# Patient Record
Sex: Female | Born: 1995 | Race: White | Hispanic: No | Marital: Single | Smoking: Never smoker
Health system: Southern US, Community
[De-identification: ages and names within clinical notes are randomized; demographics above are authoritative.]

## PROBLEM LIST (undated history)

## (undated) DIAGNOSIS — Q231 Congenital insufficiency of aortic valve: Secondary | ICD-10-CM

## (undated) DIAGNOSIS — Q2381 Bicuspid aortic valve: Secondary | ICD-10-CM

## (undated) DIAGNOSIS — F319 Bipolar disorder, unspecified: Secondary | ICD-10-CM

## (undated) DIAGNOSIS — J45909 Unspecified asthma, uncomplicated: Secondary | ICD-10-CM

## (undated) HISTORY — DX: Congenital insufficiency of aortic valve: Q23.1

## (undated) HISTORY — PX: TONSILLECTOMY: SUR1361

---

## 2019-07-15 DIAGNOSIS — L209 Atopic dermatitis, unspecified: Secondary | ICD-10-CM | POA: Diagnosis not present

## 2019-07-23 DIAGNOSIS — F900 Attention-deficit hyperactivity disorder, predominantly inattentive type: Secondary | ICD-10-CM | POA: Diagnosis not present

## 2019-07-23 DIAGNOSIS — F3176 Bipolar disorder, in full remission, most recent episode depressed: Secondary | ICD-10-CM | POA: Diagnosis not present

## 2019-07-23 DIAGNOSIS — F419 Anxiety disorder, unspecified: Secondary | ICD-10-CM | POA: Diagnosis not present

## 2019-08-08 DIAGNOSIS — L309 Dermatitis, unspecified: Secondary | ICD-10-CM | POA: Diagnosis not present

## 2019-08-08 DIAGNOSIS — Z6826 Body mass index (BMI) 26.0-26.9, adult: Secondary | ICD-10-CM | POA: Diagnosis not present

## 2019-08-08 DIAGNOSIS — J4520 Mild intermittent asthma, uncomplicated: Secondary | ICD-10-CM | POA: Diagnosis not present

## 2019-08-27 DIAGNOSIS — Z9889 Other specified postprocedural states: Secondary | ICD-10-CM | POA: Diagnosis not present

## 2019-08-27 DIAGNOSIS — Z23 Encounter for immunization: Secondary | ICD-10-CM | POA: Diagnosis not present

## 2019-08-27 DIAGNOSIS — N871 Moderate cervical dysplasia: Secondary | ICD-10-CM | POA: Diagnosis not present

## 2019-08-27 DIAGNOSIS — Z6829 Body mass index (BMI) 29.0-29.9, adult: Secondary | ICD-10-CM | POA: Diagnosis not present

## 2019-08-27 DIAGNOSIS — Z9229 Personal history of other drug therapy: Secondary | ICD-10-CM | POA: Diagnosis not present

## 2019-10-16 DIAGNOSIS — E559 Vitamin D deficiency, unspecified: Secondary | ICD-10-CM | POA: Diagnosis not present

## 2019-10-16 DIAGNOSIS — E539 Vitamin B deficiency, unspecified: Secondary | ICD-10-CM | POA: Diagnosis not present

## 2019-10-16 DIAGNOSIS — Z5181 Encounter for therapeutic drug level monitoring: Secondary | ICD-10-CM | POA: Diagnosis not present

## 2019-10-16 DIAGNOSIS — R002 Palpitations: Secondary | ICD-10-CM | POA: Diagnosis not present

## 2019-10-24 DIAGNOSIS — F900 Attention-deficit hyperactivity disorder, predominantly inattentive type: Secondary | ICD-10-CM | POA: Diagnosis not present

## 2019-10-24 DIAGNOSIS — F3176 Bipolar disorder, in full remission, most recent episode depressed: Secondary | ICD-10-CM | POA: Diagnosis not present

## 2019-10-24 DIAGNOSIS — F419 Anxiety disorder, unspecified: Secondary | ICD-10-CM | POA: Diagnosis not present

## 2019-10-29 DIAGNOSIS — F900 Attention-deficit hyperactivity disorder, predominantly inattentive type: Secondary | ICD-10-CM | POA: Diagnosis not present

## 2019-10-29 DIAGNOSIS — F419 Anxiety disorder, unspecified: Secondary | ICD-10-CM | POA: Diagnosis not present

## 2019-10-29 DIAGNOSIS — F3176 Bipolar disorder, in full remission, most recent episode depressed: Secondary | ICD-10-CM | POA: Diagnosis not present

## 2019-11-07 DIAGNOSIS — F900 Attention-deficit hyperactivity disorder, predominantly inattentive type: Secondary | ICD-10-CM | POA: Diagnosis not present

## 2019-11-07 DIAGNOSIS — F3176 Bipolar disorder, in full remission, most recent episode depressed: Secondary | ICD-10-CM | POA: Diagnosis not present

## 2019-11-07 DIAGNOSIS — F419 Anxiety disorder, unspecified: Secondary | ICD-10-CM | POA: Diagnosis not present

## 2019-12-04 DIAGNOSIS — J019 Acute sinusitis, unspecified: Secondary | ICD-10-CM | POA: Diagnosis not present

## 2020-01-28 DIAGNOSIS — F3176 Bipolar disorder, in full remission, most recent episode depressed: Secondary | ICD-10-CM | POA: Diagnosis not present

## 2020-01-28 DIAGNOSIS — F900 Attention-deficit hyperactivity disorder, predominantly inattentive type: Secondary | ICD-10-CM | POA: Diagnosis not present

## 2020-01-28 DIAGNOSIS — F419 Anxiety disorder, unspecified: Secondary | ICD-10-CM | POA: Diagnosis not present

## 2020-04-04 DIAGNOSIS — Z23 Encounter for immunization: Secondary | ICD-10-CM | POA: Diagnosis not present

## 2020-04-10 DIAGNOSIS — F3176 Bipolar disorder, in full remission, most recent episode depressed: Secondary | ICD-10-CM | POA: Diagnosis not present

## 2020-04-10 DIAGNOSIS — F900 Attention-deficit hyperactivity disorder, predominantly inattentive type: Secondary | ICD-10-CM | POA: Diagnosis not present

## 2020-04-10 DIAGNOSIS — F419 Anxiety disorder, unspecified: Secondary | ICD-10-CM | POA: Diagnosis not present

## 2020-04-18 DIAGNOSIS — R300 Dysuria: Secondary | ICD-10-CM | POA: Diagnosis not present

## 2020-04-18 DIAGNOSIS — Z6828 Body mass index (BMI) 28.0-28.9, adult: Secondary | ICD-10-CM | POA: Diagnosis not present

## 2020-04-18 DIAGNOSIS — N3000 Acute cystitis without hematuria: Secondary | ICD-10-CM | POA: Diagnosis not present

## 2020-04-30 DIAGNOSIS — F419 Anxiety disorder, unspecified: Secondary | ICD-10-CM | POA: Diagnosis not present

## 2020-04-30 DIAGNOSIS — F3176 Bipolar disorder, in full remission, most recent episode depressed: Secondary | ICD-10-CM | POA: Diagnosis not present

## 2020-04-30 DIAGNOSIS — F900 Attention-deficit hyperactivity disorder, predominantly inattentive type: Secondary | ICD-10-CM | POA: Diagnosis not present

## 2020-05-20 DIAGNOSIS — F900 Attention-deficit hyperactivity disorder, predominantly inattentive type: Secondary | ICD-10-CM | POA: Diagnosis not present

## 2020-05-20 DIAGNOSIS — F3176 Bipolar disorder, in full remission, most recent episode depressed: Secondary | ICD-10-CM | POA: Diagnosis not present

## 2020-05-20 DIAGNOSIS — F419 Anxiety disorder, unspecified: Secondary | ICD-10-CM | POA: Diagnosis not present

## 2020-05-20 DIAGNOSIS — J45909 Unspecified asthma, uncomplicated: Secondary | ICD-10-CM | POA: Diagnosis not present

## 2020-06-25 DIAGNOSIS — Z23 Encounter for immunization: Secondary | ICD-10-CM | POA: Diagnosis not present

## 2020-07-23 DIAGNOSIS — Z20822 Contact with and (suspected) exposure to covid-19: Secondary | ICD-10-CM | POA: Diagnosis not present

## 2020-07-23 DIAGNOSIS — J069 Acute upper respiratory infection, unspecified: Secondary | ICD-10-CM | POA: Diagnosis not present

## 2020-07-28 DIAGNOSIS — B370 Candidal stomatitis: Secondary | ICD-10-CM | POA: Diagnosis not present

## 2020-10-13 DIAGNOSIS — J209 Acute bronchitis, unspecified: Secondary | ICD-10-CM | POA: Diagnosis not present

## 2020-10-13 DIAGNOSIS — J4521 Mild intermittent asthma with (acute) exacerbation: Secondary | ICD-10-CM | POA: Diagnosis not present

## 2020-10-13 DIAGNOSIS — Z20822 Contact with and (suspected) exposure to covid-19: Secondary | ICD-10-CM | POA: Diagnosis not present

## 2020-11-27 DIAGNOSIS — F3176 Bipolar disorder, in full remission, most recent episode depressed: Secondary | ICD-10-CM | POA: Diagnosis not present

## 2020-11-27 DIAGNOSIS — F419 Anxiety disorder, unspecified: Secondary | ICD-10-CM | POA: Diagnosis not present

## 2020-11-27 DIAGNOSIS — F900 Attention-deficit hyperactivity disorder, predominantly inattentive type: Secondary | ICD-10-CM | POA: Diagnosis not present

## 2021-03-07 DIAGNOSIS — J4541 Moderate persistent asthma with (acute) exacerbation: Secondary | ICD-10-CM | POA: Diagnosis not present

## 2021-03-07 DIAGNOSIS — L209 Atopic dermatitis, unspecified: Secondary | ICD-10-CM | POA: Diagnosis not present

## 2021-03-12 ENCOUNTER — Other Ambulatory Visit: Payer: Self-pay

## 2021-03-12 ENCOUNTER — Encounter (HOSPITAL_COMMUNITY): Payer: Self-pay | Admitting: Emergency Medicine

## 2021-03-12 ENCOUNTER — Emergency Department (HOSPITAL_COMMUNITY): Payer: BC Managed Care – PPO

## 2021-03-12 ENCOUNTER — Emergency Department (HOSPITAL_COMMUNITY)
Admission: EM | Admit: 2021-03-12 | Discharge: 2021-03-13 | Disposition: A | Discharge status: Home / Self Care | Payer: BC Managed Care – PPO | Attending: Emergency Medicine | Admitting: Emergency Medicine

## 2021-03-12 DIAGNOSIS — R0789 Other chest pain: Secondary | ICD-10-CM | POA: Insufficient documentation

## 2021-03-12 DIAGNOSIS — J45909 Unspecified asthma, uncomplicated: Secondary | ICD-10-CM | POA: Diagnosis not present

## 2021-03-12 DIAGNOSIS — Z20822 Contact with and (suspected) exposure to covid-19: Secondary | ICD-10-CM | POA: Diagnosis not present

## 2021-03-12 DIAGNOSIS — R509 Fever, unspecified: Secondary | ICD-10-CM | POA: Diagnosis not present

## 2021-03-12 DIAGNOSIS — R079 Chest pain, unspecified: Secondary | ICD-10-CM | POA: Diagnosis not present

## 2021-03-12 DIAGNOSIS — R0602 Shortness of breath: Secondary | ICD-10-CM | POA: Diagnosis not present

## 2021-03-12 DIAGNOSIS — R000 Tachycardia, unspecified: Secondary | ICD-10-CM | POA: Diagnosis not present

## 2021-03-12 DIAGNOSIS — R059 Cough, unspecified: Secondary | ICD-10-CM | POA: Diagnosis not present

## 2021-03-12 DIAGNOSIS — Z91010 Allergy to peanuts: Secondary | ICD-10-CM | POA: Diagnosis not present

## 2021-03-12 DIAGNOSIS — N9489 Other specified conditions associated with female genital organs and menstrual cycle: Secondary | ICD-10-CM | POA: Insufficient documentation

## 2021-03-12 HISTORY — DX: Unspecified asthma, uncomplicated: J45.909

## 2021-03-12 LAB — RESP PANEL BY RT-PCR (FLU A&B, COVID) ARPGX2
Influenza A by PCR: NEGATIVE
Influenza B by PCR: NEGATIVE
SARS Coronavirus 2 by RT PCR: NEGATIVE

## 2021-03-12 LAB — CBC WITH DIFFERENTIAL/PLATELET
Abs Immature Granulocytes: 0.16 10*3/uL — ABNORMAL HIGH (ref 0.00–0.07)
Basophils Absolute: 0.1 10*3/uL (ref 0.0–0.1)
Basophils Relative: 1 %
Eosinophils Absolute: 0.3 10*3/uL (ref 0.0–0.5)
Eosinophils Relative: 2 %
HCT: 44.2 % (ref 36.0–46.0)
Hemoglobin: 14.7 g/dL (ref 12.0–15.0)
Immature Granulocytes: 1 %
Lymphocytes Relative: 17 %
Lymphs Abs: 2 10*3/uL (ref 0.7–4.0)
MCH: 30.7 pg (ref 26.0–34.0)
MCHC: 33.3 g/dL (ref 30.0–36.0)
MCV: 92.3 fL (ref 80.0–100.0)
Monocytes Absolute: 0.2 10*3/uL (ref 0.1–1.0)
Monocytes Relative: 2 %
Neutro Abs: 9.2 10*3/uL — ABNORMAL HIGH (ref 1.7–7.7)
Neutrophils Relative %: 77 %
Platelets: 487 10*3/uL — ABNORMAL HIGH (ref 150–400)
RBC: 4.79 MIL/uL (ref 3.87–5.11)
RDW: 12.2 % (ref 11.5–15.5)
WBC: 11.9 10*3/uL — ABNORMAL HIGH (ref 4.0–10.5)
nRBC: 0 % (ref 0.0–0.2)

## 2021-03-12 LAB — BASIC METABOLIC PANEL
Anion gap: 10 (ref 5–15)
BUN: 13 mg/dL (ref 6–20)
CO2: 26 mmol/L (ref 22–32)
Calcium: 10.2 mg/dL (ref 8.9–10.3)
Chloride: 104 mmol/L (ref 98–111)
Creatinine, Ser: 0.82 mg/dL (ref 0.44–1.00)
GFR, Estimated: >60 mL/min (ref >60)
Glucose, Bld: 147 mg/dL — ABNORMAL HIGH (ref 70–99)
Potassium: 4.6 mmol/L (ref 3.5–5.1)
Sodium: 140 mmol/L (ref 135–145)

## 2021-03-12 LAB — D-DIMER, QUANTITATIVE: D-Dimer, Quant: <0.27 ug/mL-FEU (ref 0.00–0.50)

## 2021-03-12 LAB — I-STAT BETA HCG BLOOD, ED (MC, WL, AP ONLY): I-stat hCG, quantitative: <5 m[IU]/mL (ref <5)

## 2021-03-12 LAB — TROPONIN I (HIGH SENSITIVITY): Troponin I (High Sensitivity): <2 ng/L (ref <18)

## 2021-03-12 MED ORDER — IOHEXOL 350 MG/ML SOLN
80.0000 mL | Freq: Once | INTRAVENOUS | Status: AC | PRN
  Administered 2021-03-12: 80 mL via INTRAVENOUS

## 2021-03-12 NOTE — ED Provider Notes (Signed)
Care assumed from [facility].  Please see her full H&P.  In short,  Megan Bernard is a 25 y.o. female presents for chest tightness and pain.  Was seen at UC earlier and sent her for PE rule out. Clear and equal breath sounds on initial provider exam.  She is taking prednisone for URI and asthma exacerbation.   Plan: CT angio and trop pending - if neg can be d/c home.   Physical Exam  BP 125/84   Pulse 92   Temp 98.6 F (37 C) (Oral)   Resp 17   Ht 5' (1.524 m)   Wt 68.9 kg   SpO2 99%   BMI 29.69 kg/m   Physical Exam Vitals and nursing note reviewed.  Constitutional:      General: She is not in acute distress.    Appearance: She is well-developed. She is not ill-appearing.  HENT:     Head: Normocephalic.  Eyes:     General: No scleral icterus.    Conjunctiva/sclera: Conjunctivae normal.  Cardiovascular:     Rate and Rhythm: Normal rate.     Comments: No longer tachycardic Pulmonary:     Effort: Pulmonary effort is normal.  Abdominal:     General: There is no distension.  Musculoskeletal:        General: Normal range of motion.     Cervical back: Normal range of motion.  Skin:    General: Skin is warm and dry.  Neurological:     Mental Status: She is alert.  Psychiatric:        Mood and Affect: Mood normal.    ED Course/Procedures     Procedures  Results for orders placed or performed during the hospital encounter of 03/12/21  Resp Panel by RT-PCR (Flu A&B, Covid) Nasopharyngeal Swab   Specimen: Nasopharyngeal Swab; Nasopharyngeal(NP) swabs in vial transport medium  Result Value Ref Range   SARS Coronavirus 2 by RT PCR NEGATIVE NEGATIVE   Influenza A by PCR NEGATIVE NEGATIVE   Influenza B by PCR NEGATIVE NEGATIVE  CBC with Differential/Platelet  Result Value Ref Range   WBC 11.9 (H) 4.0 - 10.5 K/uL   RBC 4.79 3.87 - 5.11 MIL/uL   Hemoglobin 14.7 12.0 - 15.0 g/dL   HCT 17.0 01.7 - 49.4 %   MCV 92.3 80.0 - 100.0 fL   MCH 30.7 26.0 - 34.0 pg   MCHC 33.3  30.0 - 36.0 g/dL   RDW 49.6 75.9 - 16.3 %   Platelets 487 (H) 150 - 400 K/uL   nRBC 0.0 0.0 - 0.2 %   Neutrophils Relative % 77 %   Neutro Abs 9.2 (H) 1.7 - 7.7 K/uL   Lymphocytes Relative 17 %   Lymphs Abs 2.0 0.7 - 4.0 K/uL   Monocytes Relative 2 %   Monocytes Absolute 0.2 0.1 - 1.0 K/uL   Eosinophils Relative 2 %   Eosinophils Absolute 0.3 0.0 - 0.5 K/uL   Basophils Relative 1 %   Basophils Absolute 0.1 0.0 - 0.1 K/uL   Immature Granulocytes 1 %   Abs Immature Granulocytes 0.16 (H) 0.00 - 0.07 K/uL  Basic metabolic panel  Result Value Ref Range   Sodium 140 135 - 145 mmol/L   Potassium 4.6 3.5 - 5.1 mmol/L   Chloride 104 98 - 111 mmol/L   CO2 26 22 - 32 mmol/L   Glucose, Bld 147 (H) 70 - 99 mg/dL   BUN 13 6 - 20 mg/dL   Creatinine, Ser  0.82 0.44 - 1.00 mg/dL   Calcium 62.1 8.9 - 30.8 mg/dL   GFR, Estimated >65 >78 mL/min   Anion gap 10 5 - 15  D-dimer, quantitative  Result Value Ref Range   D-Dimer, Quant <0.27 0.00 - 0.50 ug/mL-FEU  I-Stat Beta hCG blood, ED ([facility], [facility], [facility] only)  Result Value Ref Range   I-stat hCG, quantitative <5.0 <5 mIU/mL   Comment 3          Troponin I (High Sensitivity)  Result Value Ref Range   Troponin I (High Sensitivity) <2 <18 ng/L   CT Angio Chest PE W/Cm &/Or Wo Cm  Result Date: 03/13/2021 CLINICAL DATA:  Asthma, cough, chest tightness, pleuritic chest pain EXAM: CT ANGIOGRAPHY CHEST WITH CONTRAST TECHNIQUE: Multidetector CT imaging of the chest was performed using the standard protocol during bolus administration of intravenous contrast. Multiplanar CT image reconstructions and MIPs were obtained to evaluate the vascular anatomy. CONTRAST:  44mL OMNIPAQUE IOHEXOL 350 MG/ML SOLN COMPARISON:  None. FINDINGS: Cardiovascular: Satisfactory opacification of the bilateral pulmonary arteries to the lobar level. No evidence of pulmonary embolism. Although not tailored for evaluation of the thoracic aorta, there is no evidence of thoracic aortic aneurysm  or dissection. The heart is normal in size.  No pericardial effusion. Mediastinum/Nodes: No suspicious mediastinal lymphadenopathy. Visualized thyroid is unremarkable. Lungs/Pleura: Lungs are clear. No suspicious pulmonary nodules. No focal consolidation. No pleural effusion or pneumothorax. Upper Abdomen: Visualized upper abdomen is unremarkable. Musculoskeletal: Visualized osseous structures are within normal limits. Review of the MIP images confirms the above findings. IMPRESSION: No evidence of pulmonary embolism. Negative CT chest. Electronically Signed   By: Charline Bills M.D.   On: 03/13/2021 00:04     MDM   1:46 AM CT angiogram without evidence of pulmonary embolism, pneumothorax or pneumonia.  Troponin negative.  No clinical or laboratory evidence for myocarditis.  Patient did have COVID in May.  Question possible long COVID symptoms.  Patient also with lightheadedness and tachycardia with change of positions and when walking.  Concern for possibility of POTS vs other dysautonomia.  Patient will be referred for outpatient cardiology evaluation into the post-COVID care clinic.  Long discussion with patient and father about same.  They are in agreement with the plan.   Nonspecific chest pain  Shortness of breath  Tachycardia      Kathie Posa, Boyd Kerbs 03/13/21 0148    Sabas Sous, MD 03/13/21 [phone]

## 2021-03-12 NOTE — ED Triage Notes (Signed)
Patient arrives complaining of shortness of breath for 3 weeks and chest pain since Monday. Patient has been seen and was told to follow up with cardiology, but she has not yet. Patient does not smoke, but is on estrogen based bc. Patient complaining of central chest pain that radiates into her back. Patient noted to be tachypneic and tachycardic.

## 2021-03-12 NOTE — ED Notes (Signed)
Patient transported to CT 

## 2021-03-12 NOTE — ED Provider Notes (Signed)
[facility]-EMERGENCY DEPT Provider Note   CSN: [id number] Arrival date & time: 03/12/21  1931     History Chief Complaint  Patient presents with   Chest Pain   Shortness of Breath    Megan Bernard is a 25 y.o. female who presents to the ED from UC with complaint of gradual onset, constant, pleuritic, chest pain for the past week. She reports "asthma" issues for the past 3 weeks with complaint of dry cough, chest tightness, no wheezing. She states she was seen by her PCP last week and started on Prednisone without relief of her symptoms. She began having chest pain that feels different to previous chest tightness related to asthma exacerbation and went to UC today and was sent here with concern for PE given she was tachycardic, tachypneic, with hx of OCPs. Pt reports they did an antigen COVID test and a CXR at urgent care which was negative. She denies hx of DVT/PE in the past. She is on exogenous hormones. She denies recent prolonged travel or immobilization. Denies hemoptysis. No active malignancy. She reports subjective fevers and chills.   The history is provided by the patient and medical records.      Past Medical History:  Diagnosis Date   Asthma     There are no problems to display for this patient.     OB History   No obstetric history on file.     No family history on file.  Social History   Tobacco Use   Smoking status: Never   Smokeless tobacco: Never  Substance Use Topics   Alcohol use: Not Currently   Drug use: Not Currently    Home Medications Prior to Admission medications   Not on File    Allergies    Peanut-containing drug products, Amoxicillin, and Justicia adhatoda (malabar nut tree) [justicia adhatoda]  Review of Systems   Review of Systems  Constitutional:  Positive for chills and fever (subjective). Negative for diaphoresis.  Respiratory:  Positive for cough and shortness of breath.   Cardiovascular:  Positive for  chest pain.  Gastrointestinal:  Negative for nausea and vomiting.  All other systems reviewed and are negative.  Physical Exam Updated Vital Signs BP 125/84   Pulse 92   Temp 98.6 F (37 C) (Oral)   Resp 17   Ht 5' (1.524 m)   Wt 68.9 kg   SpO2 99%   BMI 29.69 kg/m   Physical Exam Vitals and nursing note reviewed.  Constitutional:      Appearance: She is not ill-appearing or diaphoretic.  HENT:     Head: Normocephalic and atraumatic.  Eyes:     Conjunctiva/sclera: Conjunctivae normal.  Cardiovascular:     Rate and Rhythm: Regular rhythm. Tachycardia present.     Heart sounds: Normal heart sounds.  Pulmonary:     Effort: Pulmonary effort is normal.     Breath sounds: Normal breath sounds. No decreased breath sounds, wheezing, rhonchi or rales.     Comments: Speaking in full sentences without difficulty. Satting 98% on RA. LCTAB without wheezing, rales, rhonchi.  Chest:     Chest wall: Tenderness present.     Comments: Diffuse chest wall TTP Abdominal:     Palpations: Abdomen is soft.     Tenderness: There is no abdominal tenderness.  Musculoskeletal:     Cervical back: Neck supple.  Skin:    General: Skin is warm and dry.  Neurological:     Mental Status: She is alert.  ED Results / Procedures / Treatments   Labs (all labs ordered are listed, but only abnormal results are displayed) Labs Reviewed  CBC WITH DIFFERENTIAL/PLATELET - Abnormal; Notable for the following components:      Result Value   WBC 11.9 (*)    Platelets 487 (*)    Neutro Abs 9.2 (*)    Abs Immature Granulocytes 0.16 (*)    All other components within normal limits  BASIC METABOLIC PANEL - Abnormal; Notable for the following components:   Glucose, Bld 147 (*)    All other components within normal limits  RESP PANEL BY RT-PCR (FLU A&B, COVID) ARPGX2  D-DIMER, QUANTITATIVE  I-STAT BETA HCG BLOOD, ED ([facility], [facility], [facility] ONLY)  TROPONIN I (HIGH SENSITIVITY)  TROPONIN I (HIGH SENSITIVITY)     EKG None  Radiology No results found.  Procedures Procedures   Medications Ordered in ED Medications  iohexol (OMNIPAQUE) 350 MG/ML injection 80 mL (80 mLs Intravenous Contrast Given 03/12/21 2337)    ED Course  I have reviewed the triage vital signs and the nursing notes.  Pertinent labs & imaging results that were available during my care of the patient were reviewed by me and considered in my medical decision making (see chart for details).    MDM Rules/Calculators/A&P                           25 year old female presents to the ED today with complaint of pleuritic chest pain and shortness of breath for the past week.  Does report she has had issues with cough for the past 3 weeks which she attributed to asthma exacerbation.  She went to urgent care today and was sent here for PE work-up.  On arrival to the ED she is afebrile at 98.6.  She is tachycardic with a heart rate of 124.  She is mildly tachypneic with respirations of 22.  She is satting 98% on room air.  She had work-up done while in the waiting room including a CBC, BMP, beta-hCG, D-dimer.  D-dimer is negative today.  She denies any other risk factors besides exogenous hormone use for PE.  Given she was sent here from urgent care to rule out PE I do feel she will likely need a CTA for further evaluation.  We will plan to add on troponin at this time given she is having chest pain.  Question pericarditis with recent viral URI.  We will also swab for COVID and flu.  She had an antigen COVID test today which was negative.  Do not feel repeat chest x-ray is necessary at this time however we will plan for CTA to evaluate for possible PE versus other pulmonary etiology.   At shift change case signed out to oncoming team [facility], PA-C, who will follow up on CTA, troponin, COVID/flu testing. If all negative feel pt can be discharged home with symptomatic treatment.   This note was prepared using Dragon voice  recognition software and may include unintentional dictation errors due to the inherent limitations of voice recognition software.   Final Clinical Impression(s) / ED Diagnoses Final diagnoses:  Nonspecific chest pain  Shortness of breath    Rx / DC Orders ED Discharge Orders     None        Tanda Rockers, PA-C 03/12/21 2343    Alvira Monday, MD 03/13/21 1000

## 2021-03-12 NOTE — ED Provider Notes (Signed)
Emergency Medicine Provider Triage Evaluation Note  Megan Bernard , a 25 y.o. female  was evaluated in triage.  Pt complains of shortness of breath.  Pt seen at Urgent care and sent here to rule out PE.    Review of Systems  Positive: Short of breath Negative: fever  Physical Exam  BP (!) 145/96 (BP Location: Left Arm)   Pulse (!) 124   Temp 98.6 F (37 C) (Oral)   Resp (!) 22   Ht 5' (1.524 m)   Wt 68.9 kg   SpO2 98%   BMI 29.69 kg/m  Gen:   Awake, no distress   Resp:  Normal effort  MSK:   Moves extremities without difficulty  Other:  Tachycardia   Medical Decision Making  Medically screening exam initiated at 7:55 PM.  Appropriate orders placed.  Megan Bernard was informed that the remainder of the evaluation will be completed by another provider, this initial triage assessment does not replace that evaluation, and the importance of remaining in the ED until their evaluation is complete.     Megan Bernard 03/12/21 1956    Megan Dibbles, MD 03/13/21 [phone]

## 2021-03-13 MED ORDER — OMEPRAZOLE 20 MG PO CPDR: 20.0000 mg | DELAYED_RELEASE_CAPSULE | Freq: Every day | ORAL | 0 refills | Status: DC

## 2021-03-13 MED ORDER — BENZONATATE 100 MG PO CAPS: 100.0000 mg | ORAL_CAPSULE | Freq: Three times a day (TID) | ORAL | 0 refills | Status: DC | PRN

## 2021-03-13 NOTE — Discharge Instructions (Addendum)
1. Medications: Alternate Tylenol and ibuprofen for body aches, headache and fever control.  600 mg of ibuprofen 3 times a day with food alternated with 1000 mg of Tylenol, Tessalon Perles for cough, omeprazole for potential gastritis, usual home medications 2. Treatment: rest, drink plenty of fluids, compression socks, increase salt intake 3. Follow Up: Please followup with your primary doctor, cardiology and post-COVID care clinic in the next week for discussion of your diagnoses and further evaluation after today's visit; if you do not have a primary care doctor use the resource guide provided to find one; Please return to the ER for new or worsening symptoms

## 2021-03-18 ENCOUNTER — Other Ambulatory Visit: Payer: Self-pay

## 2021-03-18 ENCOUNTER — Ambulatory Visit (HOSPITAL_BASED_OUTPATIENT_CLINIC_OR_DEPARTMENT_OTHER): Payer: BC Managed Care – PPO

## 2021-03-18 ENCOUNTER — Ambulatory Visit (INDEPENDENT_AMBULATORY_CARE_PROVIDER_SITE_OTHER): Payer: BC Managed Care – PPO | Admitting: Cardiology

## 2021-03-18 ENCOUNTER — Encounter (HOSPITAL_BASED_OUTPATIENT_CLINIC_OR_DEPARTMENT_OTHER): Payer: Self-pay | Admitting: Cardiology

## 2021-03-18 VITALS — BP 123/89 | HR 93 | Ht 60.0 in | Wt 149.8 lb

## 2021-03-18 DIAGNOSIS — I479 Paroxysmal tachycardia, unspecified: Secondary | ICD-10-CM | POA: Diagnosis not present

## 2021-03-18 DIAGNOSIS — Z8279 Family history of other congenital malformations, deformations and chromosomal abnormalities: Secondary | ICD-10-CM | POA: Diagnosis not present

## 2021-03-18 DIAGNOSIS — Z8249 Family history of ischemic heart disease and other diseases of the circulatory system: Secondary | ICD-10-CM

## 2021-03-18 DIAGNOSIS — R0602 Shortness of breath: Secondary | ICD-10-CM

## 2021-03-18 DIAGNOSIS — R079 Chest pain, unspecified: Secondary | ICD-10-CM

## 2021-03-18 DIAGNOSIS — Z7189 Other specified counseling: Secondary | ICD-10-CM

## 2021-03-18 NOTE — Patient Instructions (Signed)
Medication Instructions:  Your Physician recommend you continue on your current medication as directed.    *If you need a refill on your cardiac medications before your next appointment, please call your pharmacy*   Lab Work: None ordered today   Testing/Procedures: Your physician has requested that you have an echocardiogram. Echocardiography is a painless test that uses sound waves to create images of your heart. It provides your doctor with information about the size and shape of your heart and how well your heart's chambers and valves are working. This procedure takes approximately one hour. There are no restrictions for this procedure. [address]  Our physician has recommended that you wear an  7 DAY ZIO-PATCH monitor. The Zio patch cardiac monitor continuously records heart rhythm data for up to 14 days, this is for patients being evaluated for multiple types heart rhythms. For the first 24 hours post application, please avoid getting the Zio monitor wet in the shower or by excessive sweating during exercise. After that, feel free to carry on with regular activities. Keep soaps and lotions away from the ZIO XT Patch.      Follow-Up: At [facility], you and your health needs are our priority.  As part of our continuing mission to provide you with exceptional heart care, we have created designated Provider Care Teams.  These Care Teams include your primary Cardiologist (physician) and Advanced Practice Providers (APPs -  Physician Assistants and Nurse Practitioners) who all work together to provide you with the care you need, when you need it.  We recommend signing up for the patient portal called "MyChart".  Sign up information is provided on this After Visit Summary.  MyChart is used to connect with patients for Virtual Visits (Telemedicine).  Patients are able to view lab/test results, encounter notes, upcoming appointments, etc.  Non-urgent messages can be sent  to your provider as well.   To learn more about what you can do with MyChart, go to [email or web address].    Your next appointment:   Based on test results  The format for your next appointment:   In Person  Provider:   Jodelle Red, MD  Christena Deem- Long Term Monitor Instructions  Your physician has requested you wear a ZIO patch monitor for 7 days.  This is a single patch monitor. [employer] supplies one patch monitor per enrollment. Additional stickers are not available. Please do not apply patch if you will be having a Nuclear Stress Test,  Echocardiogram, Cardiac CT, MRI, or Chest Xray during the period you would be wearing the  monitor. The patch cannot be worn during these tests. You cannot remove and re-apply the  ZIO XT patch monitor.  Your ZIO patch monitor will be mailed 3 day USPS to your address on file. It may take 3-5 days  to receive your monitor after you have been enrolled.  Once you have received your monitor, please review the enclosed instructions. Your monitor  has already been registered assigning a specific monitor serial # to you.  Billing and Patient Assistance Program Information  We have supplied [employer] with any of your insurance information on file for billing purposes. [employer] offers a sliding scale Patient Assistance Program for patients that do not have  insurance, or whose insurance does not completely cover the cost of the ZIO monitor.  You must apply for the Patient Assistance Program to qualify for this discounted rate.  To apply, please call [employer] at [phone], select option 4, select  option 2, ask to apply for  Patient Assistance Program. Meredeth Ide will ask your household income, and how many people  are in your household. They will quote your out-of-pocket cost based on that information.  [employer] will also be able to set up a 7-month, interest-free payment plan if needed.  Applying the monitor   Shave hair from upper left chest.   Hold abrader disc by orange tab. Rub abrader in 40 strokes over the upper left chest as  indicated in your monitor instructions.  Clean area with 4 enclosed alcohol pads. Let dry.  Apply patch as indicated in monitor instructions. Patch will be placed under collarbone on left  side of chest with arrow pointing upward.  Rub patch adhesive wings for 2 minutes. Remove white label marked "1". Remove the white  label marked "2". Rub patch adhesive wings for 2 additional minutes.  While looking in a mirror, press and release button in center of patch. A small green light will  flash 3-4 times. This will be your only indicator that the monitor has been turned on.  Do not shower for the first 24 hours. You may shower after the first 24 hours.  Press the button if you feel a symptom. You will hear a small click. Record Date, Time and  Symptom in the Patient Logbook.  When you are ready to remove the patch, follow instructions on the last 2 pages of Patient  Logbook. Stick patch monitor onto the last page of Patient Logbook.  Place Patient Logbook in the blue and white box. Use locking tab on box and tape box closed  securely. The blue and white box has prepaid postage on it. Please place it in the mailbox as  soon as possible. Your physician should have your test results approximately 7 days after the  monitor has been mailed back to [facility].  Call [facility] Customer Care at [phone] if you have questions regarding  your ZIO XT patch monitor. Call them immediately if you see an orange light blinking on your  monitor.  If your monitor falls off in less than 4 days, contact our Monitor department at [phone].  If your monitor becomes loose or falls off after 4 days call [employer] at [phone] for  suggestions on securing your monitor

## 2021-03-18 NOTE — Progress Notes (Signed)
Cardiology Office Note:    Date:  03/18/2021   ID:  Megan Bernard, DOB 1996-05-02, MRN [id number]  PCP:  Pcp, No  Cardiologist:  Jodelle Red, MD  Referring MD: Dr. Dalene Seltzer (ER)  CC: new patient consultation for tachycardia.  History of Present Illness:    Megan Bernard is a 25 y.o. female without significant PMH who is seen as a new consult at the request of Dr. Dalene Seltzer for the evaluation and management of tachycardia.  ER notes from 03/12/2021 reviewed. hsTnI <2, resp panel negative for Covid, hcg negative, d dimer negative, BMET unremarkable, CBC with WBC 11.9. CTPE negative for PE, otherwise unremarkable.  Today: She notes that she has chronically had a faster heart rate. She has felt lightheaded and has had intermittent syncope for years. She had Covid in May, felt that she was doing fine until several weeks ago. Noted to have chest tightness and shortness of breath, sent to ER due to concern for PE. Workup unremarkable, referred to cardiology   She has felt "like total ass" for the last few weeks. No energy, can't get out of bed. Is in law school, has been extremely difficult to go to class. Has also broken out in a rash across her body. She has never been diagnosed with an autoimmune disease but reports having flare type events in the past. Continues to have chest pain, shortness of breath, and nonproductive.  Last syncope >1 year ago, but has frequent near syncope. Noted that her heart rate can get to 150 bpm and still be 120s after lying down.   She is active and does cardio. Likes to do elliptical. Can get heart rate to target easily. Wears out easily, tired after a workout. Does it because she feels like she should, not because she wants to.  Her mother has fibromyalgia, EBV, many heart issues. Had a murmur, had a valve issue. Had aorta and some valve replaced. Has bicuspid aortic valve. Patient has been told she needs evaluation but has never seen a cardiologist or  had an echo.   Maternal cousin has worn a monitor, ?pacemaker  She is taking medications for mental health. Stopped lexapro two weeks ago; she was feeling somewhat bad before hand but felt worse when she stopped. Planning to start back today.   Denies chest pain, shortness of breath at rest or with normal exertion. No PND, orthopnea, LE edema or unexpected weight gain.  Past Medical History:  Diagnosis Date   Asthma     History reviewed. No pertinent surgical history.  Current Medications: Current Outpatient Medications on File Prior to Visit  Medication Sig   albuterol (PROVENTIL) (2.5 MG/3ML) 0.083% nebulizer solution as needed.   albuterol (VENTOLIN HFA) 108 (90 Base) MCG/ACT inhaler as needed.   beclomethasone (QVAR REDIHALER) 40 MCG/ACT inhaler as needed.   buPROPion (WELLBUTRIN XL) 150 MG 24 hr tablet daily.   buPROPion (WELLBUTRIN XL) 300 MG 24 hr tablet daily.   EPINEPHrine (EPIPEN JR) 0.15 MG/0.3ML injection Inject into the muscle as needed.   escitalopram (LEXAPRO) 10 MG tablet Take 10 mg by mouth daily.   hydrOXYzine (ATARAX/VISTARIL) 25 MG tablet as needed.   Lactobacillus-Inulin (CULTURELLE DIGESTIVE DAILY) CAPS Take by mouth daily at 6 (six) AM.   lamoTRIgine (LAMICTAL) 200 MG tablet Take 200 mg by mouth daily.   LORazepam (ATIVAN) 0.5 MG tablet as needed.   methylphenidate (RITALIN) 5 MG tablet Take 7.5-10 mg by mouth as needed.   norgestimate-ethinyl estradiol (ORTHO-CYCLEN) 0.25-35 MG-MCG tablet  Take 1 tablet by mouth daily.   triamcinolone ointment (KENALOG) 0.1 % Apply topically as needed.   No current facility-administered medications on file prior to visit.     Allergies:   Peanut-containing drug products, Amoxicillin, Nickel, Other, and Justicia adhatoda (malabar nut tree) [justicia adhatoda]   Social History   Tobacco Use   Smoking status: Never   Smokeless tobacco: Never  Substance Use Topics   Alcohol use: Not Currently   Drug use: Not Currently     Family History: Her mother has fibromyalgia, EBV, many heart issues. Had a murmur, had a valve issue. Had aorta and some valve replaced. Has bicuspid aortic valve. Patient has been told she needs evaluation but has never seen a cardiologist or had an echo.   ROS:   Please see the history of present illness.  Additional pertinent ROS: Constitutional: Positive for chills, fever, night sweats  HENT: Negative for ear pain and hearing loss.   Eyes: Negative for loss of vision and eye pain.  Respiratory: Negative for sputum, wheezing.  Positive is nonproductive  Cardiovascular: See HPI. Gastrointestinal: Negative for abdominal pain, melena, and hematochezia.  Genitourinary: Negative for dysuria and hematuria.  Musculoskeletal: Negative for falls and myalgias.  Skin: Positive for itching and rash.  Neurological: Negative for focal weakness, focal sensory changes and loss of consciousness.  Endo/Heme/Allergies: Does bruise/bleed easily.     EKGs/Labs/Other Studies Reviewed:    The following studies were reviewed today: No prior cardiac studies.   EKG:  EKG is personally reviewed.   03/18/21 NSR at 93 bpm, iRBBB (normal for age)  Recent Labs: 03/12/2021: BUN 13; Creatinine, Ser 0.82; Hemoglobin 14.7; Platelets 487; Potassium 4.6; Sodium 140  Recent Lipid Panel No results found for: CHOL, TRIG, HDL, CHOLHDL, VLDL, LDLCALC, LDLDIRECT  Physical Exam:    VS:  BP 123/89   Pulse 93   Ht 5' (1.524 m)   Wt 149 lb 12.8 oz (67.9 kg)   SpO2 96%   BMI 29.26 kg/m    Orthostatics; Lying 118/76, HR 88 Sitting 128/88, HR 89 Standing 123/78, HR 90 Standing 3 min 126/87, HR 95  Wt Readings from Last 3 Encounters:  03/18/21 149 lb 12.8 oz (67.9 kg)  03/12/21 152 lb (68.9 kg)    GEN: Well nourished, well developed in no acute distress HEENT: Normal, moist mucous membranes NECK: No JVD CARDIAC: regular rhythm, normal S1 and S2, no rubs or gallops. No murmur. VASCULAR: Radial and DP pulses  2+ bilaterally. No carotid bruits RESPIRATORY:  Clear to auscultation without rales, wheezing or rhonchi  ABDOMEN: Soft, non-tender, non-distended MUSCULOSKELETAL:  Ambulates independently SKIN: Warm and dry, no edema. Scattered papular rash across chest, arms, neck, face, and upper legs. Reports similar to her prior eczema flares. NEUROLOGIC:  Alert and oriented x 3. No focal neuro deficits noted. PSYCHIATRIC:  Normal affect    ASSESSMENT:    1. Family history of bicuspid aortic valve   2. Paroxysmal tachycardia (HCC)   3. Family history of heart disease   4. Chest pain of uncertain etiology   5. Shortness of breath   6. Cardiac risk counseling   7. Counseling on health promotion and disease prevention    PLAN:    Chest tightness, shortness of breath -unremarkable workup in the ER -negative Tn, unlikely endocarditis -negative PE -lungs clear on CT  Tachycardia, intermittent Near syncope -improved while in ER -history of Covid in 11/2020, concern for dysautonomia -orthostatics today not suggestive of POTS -reviewed recommendations  for hydration, compression stockings -will place event monitor for further evaluation -ECG unremarkable today  Mother with bicuspid aortic valve -echocardiogram for screening  Cardiac risk counseling and prevention recommendations: -recommend heart healthy/Mediterranean diet, with whole grains, fruits, vegetable, fish, lean meats, nuts, and olive oil. Limit salt. -recommend moderate walking, 3-5 times/week for 30-50 minutes each session. Aim for at least 150 minutes.week. Goal should be pace of 3 miles/hours, or walking 1.5 miles in 30 minutes -recommend avoidance of tobacco products. Avoid excess alcohol. -ASCVD risk score: The ASCVD Risk score Denman George DC Jr., et al., 2013) failed to calculate for the following reasons:   The 2013 ASCVD risk score is only valid for ages 39 to 70    Plan for follow up: to be determined based on results of  testing  Jodelle Red, MD, PhD, [facility] [place]  [facility]    Medication Adjustments/Labs and Tests Ordered: Current medicines are reviewed at length with the patient today.  Concerns regarding medicines are outlined above.  Orders Placed This Encounter  Procedures   LONG TERM MONITOR (3-14 DAYS)   EKG 12-Lead   ECHOCARDIOGRAM COMPLETE    No orders of the defined types were placed in this encounter.   Patient Instructions  Medication Instructions:  Your Physician recommend you continue on your current medication as directed.    *If you need a refill on your cardiac medications before your next appointment, please call your pharmacy*   Lab Work: None ordered today   Testing/Procedures: Your physician has requested that you have an echocardiogram. Echocardiography is a painless test that uses sound waves to create images of your heart. It provides your doctor with information about the size and shape of your heart and how well your heart's chambers and valves are working. This procedure takes approximately one hour. There are no restrictions for this procedure. [address]  Our physician has recommended that you wear an  7 DAY ZIO-PATCH monitor. The Zio patch cardiac monitor continuously records heart rhythm data for up to 14 days, this is for patients being evaluated for multiple types heart rhythms. For the first 24 hours post application, please avoid getting the Zio monitor wet in the shower or by excessive sweating during exercise. After that, feel free to carry on with regular activities. Keep soaps and lotions away from the ZIO XT Patch.      Follow-Up: At [facility], you and your health needs are our priority.  As part of our continuing mission to provide you with exceptional heart care, we have created designated Provider Care Teams.  These Care Teams include your primary Cardiologist (physician) and Advanced Practice Providers  (APPs -  Physician Assistants and Nurse Practitioners) who all work together to provide you with the care you need, when you need it.  We recommend signing up for the patient portal called "MyChart".  Sign up information is provided on this After Visit Summary.  MyChart is used to connect with patients for Virtual Visits (Telemedicine).  Patients are able to view lab/test results, encounter notes, upcoming appointments, etc.  Non-urgent messages can be sent to your provider as well.   To learn more about what you can do with MyChart, go to [email or web address].    Your next appointment:   Based on test results  The format for your next appointment:   In Person  Provider:   Jodelle Red, MD  Christena Deem- Long Term Monitor Instructions  Your physician has requested you wear  a ZIO patch monitor for 7 days.  This is a single patch monitor. [employer] supplies one patch monitor per enrollment. Additional stickers are not available. Please do not apply patch if you will be having a Nuclear Stress Test,  Echocardiogram, Cardiac CT, MRI, or Chest Xray during the period you would be wearing the  monitor. The patch cannot be worn during these tests. You cannot remove and re-apply the  ZIO XT patch monitor.  Your ZIO patch monitor will be mailed 3 day USPS to your address on file. It may take 3-5 days  to receive your monitor after you have been enrolled.  Once you have received your monitor, please review the enclosed instructions. Your monitor  has already been registered assigning a specific monitor serial # to you.  Billing and Patient Assistance Program Information  We have supplied [employer] with any of your insurance information on file for billing purposes. [employer] offers a sliding scale Patient Assistance Program for patients that do not have  insurance, or whose insurance does not completely cover the cost of the ZIO monitor.  You must apply for the Patient Assistance Program to  qualify for this discounted rate.  To apply, please call [employer] at [phone], select option 4, select option 2, ask to apply for  Patient Assistance Program. Meredeth Ide will ask your household income, and how many people  are in your household. They will quote your out-of-pocket cost based on that information.  [employer] will also be able to set up a 57-month, interest-free payment plan if needed.  Applying the monitor   Shave hair from upper left chest.  Hold abrader disc by orange tab. Rub abrader in 40 strokes over the upper left chest as  indicated in your monitor instructions.  Clean area with 4 enclosed alcohol pads. Let dry.  Apply patch as indicated in monitor instructions. Patch will be placed under collarbone on left  side of chest with arrow pointing upward.  Rub patch adhesive wings for 2 minutes. Remove white label marked "1". Remove the white  label marked "2". Rub patch adhesive wings for 2 additional minutes.  While looking in a mirror, press and release button in center of patch. A small green light will  flash 3-4 times. This will be your only indicator that the monitor has been turned on.  Do not shower for the first 24 hours. You may shower after the first 24 hours.  Press the button if you feel a symptom. You will hear a small click. Record Date, Time and  Symptom in the Patient Logbook.  When you are ready to remove the patch, follow instructions on the last 2 pages of Patient  Logbook. Stick patch monitor onto the last page of Patient Logbook.  Place Patient Logbook in the blue and white box. Use locking tab on box and tape box closed  securely. The blue and white box has prepaid postage on it. Please place it in the mailbox as  soon as possible. Your physician should have your test results approximately 7 days after the  monitor has been mailed back to [facility].  Call [facility] Customer Care at [phone] if you have questions regarding  your ZIO XT  patch monitor. Call them immediately if you see an orange light blinking on your  monitor.  If your monitor falls off in less than 4 days, contact our Monitor department at [phone].  If your monitor becomes loose or falls off after 4 days call [employer] at  [phone] for  suggestions on securing your monitor   Signed, Jodelle Red, MD PhD 03/18/2021   [facility]

## 2021-03-21 DIAGNOSIS — L209 Atopic dermatitis, unspecified: Secondary | ICD-10-CM | POA: Diagnosis not present

## 2021-03-23 ENCOUNTER — Other Ambulatory Visit: Payer: Self-pay

## 2021-03-23 ENCOUNTER — Ambulatory Visit (INDEPENDENT_AMBULATORY_CARE_PROVIDER_SITE_OTHER): Payer: BC Managed Care – PPO | Admitting: Nurse Practitioner

## 2021-03-23 VITALS — BP 115/77 | HR 97 | Temp 97.8°F | Resp 18 | Ht 60.0 in | Wt 149.2 lb

## 2021-03-23 DIAGNOSIS — Z8709 Personal history of other diseases of the respiratory system: Secondary | ICD-10-CM

## 2021-03-23 DIAGNOSIS — R5381 Other malaise: Secondary | ICD-10-CM

## 2021-03-23 DIAGNOSIS — R000 Tachycardia, unspecified: Secondary | ICD-10-CM

## 2021-03-23 DIAGNOSIS — R0602 Shortness of breath: Secondary | ICD-10-CM | POA: Diagnosis not present

## 2021-03-23 DIAGNOSIS — R053 Chronic cough: Secondary | ICD-10-CM | POA: Diagnosis not present

## 2021-03-23 DIAGNOSIS — Z8616 Personal history of COVID-19: Secondary | ICD-10-CM

## 2021-03-23 MED ORDER — BUDESONIDE-FORMOTEROL FUMARATE 80-4.5 MCG/ACT IN AERO: 2.0000 | INHALATION_SPRAY | Freq: Two times a day (BID) | RESPIRATORY_TRACT | 3 refills | Status: DC

## 2021-03-23 NOTE — Patient Instructions (Addendum)
History of Covid Cough Fatigue Tachycardia:   Stay well hydrated  Stay active  Deep breathing exercises  May take tylenol for fever or pain  Will place referral to pulmonary  Will place referral to EP  Will place referral to PT  Continue Prilosec  Will order Symbicort   Follow up:  Follow up in 3 months or sooner if needed

## 2021-03-23 NOTE — Progress Notes (Signed)
Pt presents for post Covid symptoms

## 2021-03-23 NOTE — Progress Notes (Signed)
@Patient  ID: , female    DOB: 1995-12-23, 25 y.o.   MRN: [id number]  Chief Complaint  Patient presents with   POST COVID     Referring provider: No ref. provider found   HPI  Patient presents today for post-COVID care clinic visit.  Patient states she tested positive for COVID in May and did receive Paxlovid.  Patient states that she does have cough, intermittent chest pain, heart racing, issues with blood pressure fluctuating, and fatigue.  Patient has been evaluated by cardiology and has been ordered Zio patch monitor.  She did have EKG and orthostatic blood pressures were not concerning for POTS. Denies f/c/s, n/v/d, hemoptysis, PND, edema.      Allergies  Allergen Reactions   Peanut-Containing Drug Products Anaphylaxis and Rash   Amoxicillin Rash   Nickel Dermatitis    asthmarash  asthmarash asthmarash asthmarash  asthmarash asthmarash    Other Rash    Nuts Nickel Nuts Nickel Nuts Nickel Nuts Nickel    Justicia Adhatoda (Malabar Nut Tree) [Justicia Adhatoda] Rash    All nuts     There is no immunization history on file for this patient.  Past Medical History:  Diagnosis Date   Asthma     Tobacco History: Social History   Tobacco Use  Smoking Status Never  Smokeless Tobacco Never   Counseling given: Not Answered   Outpatient Encounter Medications as of 03/23/2021  Medication Sig   budesonide-formoterol (SYMBICORT) 80-4.5 MCG/ACT inhaler Inhale 2 puffs into the lungs 2 (two) times daily.   budesonide-formoterol (SYMBICORT) 80-4.5 MCG/ACT inhaler Inhale 2 puffs into the lungs 2 (two) times daily.   predniSONE (STERAPRED UNI-PAK 21 TAB) 10 MG (21) TBPK tablet Take by mouth daily.   albuterol (PROVENTIL) (2.5 MG/3ML) 0.083% nebulizer solution as needed.   albuterol (VENTOLIN HFA) 108 (90 Base) MCG/ACT inhaler as needed.   beclomethasone (QVAR REDIHALER) 40 MCG/ACT inhaler as needed.   buPROPion (WELLBUTRIN XL) 150 MG 24 hr tablet  daily.   buPROPion (WELLBUTRIN XL) 300 MG 24 hr tablet daily.   EPINEPHrine (EPIPEN JR) 0.15 MG/0.3ML injection Inject into the muscle as needed.   escitalopram (LEXAPRO) 10 MG tablet Take 10 mg by mouth daily.   hydrOXYzine (ATARAX/VISTARIL) 25 MG tablet as needed.   Lactobacillus-Inulin (CULTURELLE DIGESTIVE DAILY) CAPS Take by mouth daily at 6 (six) AM.   lamoTRIgine (LAMICTAL) 200 MG tablet Take 200 mg by mouth daily.   LORazepam (ATIVAN) 0.5 MG tablet as needed.   methylphenidate (RITALIN) 5 MG tablet Take 7.5-10 mg by mouth as needed.   norgestimate-ethinyl estradiol (ORTHO-CYCLEN) 0.25-35 MG-MCG tablet Take 1 tablet by mouth daily.   triamcinolone ointment (KENALOG) 0.1 % Apply topically as needed.   No facility-administered encounter medications on file as of 03/23/2021.     Review of Systems  Review of Systems  Constitutional:  Positive for fatigue.  Respiratory:  Positive for cough.   Cardiovascular:  Positive for chest pain and palpitations.      Physical Exam  BP 115/77 (BP Location: Left Arm, Patient Position: Sitting, Cuff Size: Normal)   Pulse 97   Temp 97.8 F (36.6 C)   Resp 18   Ht 5' (1.524 m)   Wt 149 lb 3.2 oz (67.7 kg)   SpO2 97%   BMI 29.14 kg/m   Wt Readings from Last 5 Encounters:  03/23/21 149 lb 3.2 oz (67.7 kg)  03/18/21 149 lb 12.8 oz (67.9 kg)  03/12/21 152 lb (68.9 kg)  Physical Exam Vitals and nursing note reviewed.  Constitutional:      General: She is not in acute distress.    Appearance: She is well-developed.  Cardiovascular:     Rate and Rhythm: Normal rate and regular rhythm.  Pulmonary:     Effort: Pulmonary effort is normal.     Breath sounds: Normal breath sounds.  Neurological:     Mental Status: She is alert and oriented to person, place, and time.     Lab Results:  CBC    Component Value Date/Time   WBC 11.9 (H) 03/12/2021 2027   RBC 4.79 03/12/2021 2027   HGB 14.7 03/12/2021 2027   HCT 44.2 03/12/2021  2027   PLT 487 (H) 03/12/2021 2027   MCV 92.3 03/12/2021 2027   MCH 30.7 03/12/2021 2027   MCHC 33.3 03/12/2021 2027   RDW 12.2 03/12/2021 2027   LYMPHSABS 2.0 03/12/2021 2027   MONOABS 0.2 03/12/2021 2027   EOSABS 0.3 03/12/2021 2027   BASOSABS 0.1 03/12/2021 2027    BMET    Component Value Date/Time   NA 140 03/12/2021 2027   K 4.6 03/12/2021 2027   CL 104 03/12/2021 2027   CO2 26 03/12/2021 2027   GLUCOSE 147 (H) 03/12/2021 2027   BUN 13 03/12/2021 2027   CREATININE 0.82 03/12/2021 2027   CALCIUM 10.2 03/12/2021 2027   GFRNONAA >60 03/12/2021 2027    BNP No results found for: BNP  ProBNP No results found for: PROBNP  Imaging: No results found.   Assessment & Plan:   History of COVID-19 Cough Fatigue Tachycardia:   Stay well hydrated  Stay active  Deep breathing exercises  May take tylenol for fever or pain  Will place referral to pulmonary  Will place referral to EP  Will place referral to PT  Continue Prilosec  Will order Symbicort   Follow up:  Follow up in 3 months or sooner if needed     Ivonne Andrew, NP 06/22/2021

## 2021-03-27 MED ORDER — BUDESONIDE-FORMOTEROL FUMARATE 80-4.5 MCG/ACT IN AERO: 2.0000 | INHALATION_SPRAY | Freq: Two times a day (BID) | RESPIRATORY_TRACT | 12 refills | Status: DC

## 2021-04-06 ENCOUNTER — Ambulatory Visit (HOSPITAL_COMMUNITY): Payer: BC Managed Care – PPO | Attending: Cardiology

## 2021-04-06 ENCOUNTER — Other Ambulatory Visit: Payer: Self-pay

## 2021-04-06 DIAGNOSIS — Z8279 Family history of other congenital malformations, deformations and chromosomal abnormalities: Secondary | ICD-10-CM | POA: Diagnosis not present

## 2021-04-06 DIAGNOSIS — Z1379 Encounter for other screening for genetic and chromosomal anomalies: Secondary | ICD-10-CM | POA: Diagnosis not present

## 2021-04-06 LAB — ECHOCARDIOGRAM COMPLETE
Area-P 1/2: 4.61 cm2
S' Lateral: 2.9 cm

## 2021-04-08 ENCOUNTER — Telehealth: Payer: Self-pay | Admitting: Cardiology

## 2021-04-08 NOTE — Telephone Encounter (Signed)
° °  Pt is returning call to get echo result °

## 2021-04-10 NOTE — Telephone Encounter (Signed)
Pt updated with ECHO results and verbalized understanding.  

## 2021-04-22 ENCOUNTER — Encounter (HOSPITAL_BASED_OUTPATIENT_CLINIC_OR_DEPARTMENT_OTHER): Payer: Self-pay

## 2021-04-22 ENCOUNTER — Telehealth (HOSPITAL_BASED_OUTPATIENT_CLINIC_OR_DEPARTMENT_OTHER): Payer: Self-pay | Admitting: Cardiology

## 2021-04-22 NOTE — Telephone Encounter (Signed)
Attempted to reach patient--left message to call back. ZIO monitor has not been returned. Will send MyChart message as well.

## 2021-05-05 ENCOUNTER — Telehealth (HOSPITAL_BASED_OUTPATIENT_CLINIC_OR_DEPARTMENT_OTHER): Payer: Self-pay | Admitting: Cardiology

## 2021-05-05 NOTE — Telephone Encounter (Signed)
LMTCB concerning ZIO monitor---not returned

## 2021-05-07 ENCOUNTER — Institutional Professional Consult (permissible substitution): Payer: BC Managed Care – PPO | Admitting: Cardiology

## 2021-05-07 NOTE — Progress Notes (Incomplete)
Electrophysiology Office Note:    Date:  05/07/2021   ID:  Megan Bernard, DOB [birth year], MRN [id number]  PCP:  Pcp, No  [facility] Cardiologist:  Jodelle Red, MD  [facility] Electrophysiologist:  None   Referring MD: Ivonne Andrew, NP   Chief Complaint: Tachycardia  History of Present Illness:    Megan Bernard is a 25 y.o. female who presents for an evaluation of tachycardia at the request of Dr. Cristal Deer. Their medical history includes asthma.  The patient was seen by Dr. Cristal Deer on March 18, 2021 for tachycardia.  At that appointment she reported a chronically elevated heart rate.  She also reported fatigue at that appointment.     Past Medical History:  Diagnosis Date   Asthma     No past surgical history on file.  Current Medications: No outpatient medications have been marked as taking for the 05/07/21 encounter (Appointment) with Lanier Prude, MD.     Allergies:   Peanut-containing drug products, Amoxicillin, Nickel, Other, and Justicia adhatoda (malabar nut tree) [justicia adhatoda]   Social History   Socioeconomic History   Marital status: Single    Spouse name: Not on file   Number of children: Not on file   Years of education: Not on file   Highest education level: Not on file  Occupational History   Not on file  Tobacco Use   Smoking status: Never   Smokeless tobacco: Never  Substance and Sexual Activity   Alcohol use: Not Currently   Drug use: Not Currently   Sexual activity: Not on file  Other Topics Concern   Not on file  Social History Narrative   Not on file   Social Determinants of Health   Financial Resource Strain: Not on file  Food Insecurity: Not on file  Transportation Needs: Not on file  Physical Activity: Not on file  Stress: Not on file  Social Connections: Not on file     Family History: The patient's family history is not on file.  ROS:   Please see the history of present illness.     All other systems reviewed and are negative.  EKGs/Labs/Other Studies Reviewed:    The following studies were reviewed today: ***  EKG:  The ekg ordered today demonstrates ***   Recent Labs: 03/12/2021: BUN 13; Creatinine, Ser 0.82; Hemoglobin 14.7; Platelets 487; Potassium 4.6; Sodium 140  Recent Lipid Panel No results found for: CHOL, TRIG, HDL, CHOLHDL, VLDL, LDLCALC, LDLDIRECT  Physical Exam:    VS:  There were no vitals taken for this visit.    Wt Readings from Last 3 Encounters:  03/23/21 149 lb 3.2 oz (67.7 kg)  03/18/21 149 lb 12.8 oz (67.9 kg)  03/12/21 152 lb (68.9 kg)     GEN: *** Well nourished, well developed in no acute distress HEENT: Normal NECK: No JVD; No carotid bruits LYMPHATICS: No lymphadenopathy CARDIAC: ***RRR, no murmurs, rubs, gallops RESPIRATORY:  Clear to auscultation without rales, wheezing or rhonchi  ABDOMEN: Soft, non-tender, non-distended MUSCULOSKELETAL:  No edema; No deformity  SKIN: Warm and dry NEUROLOGIC:  Alert and oriented x 3 PSYCHIATRIC:  Normal affect       ASSESSMENT:    No diagnosis found. PLAN:    In order of problems listed above:         Total time spent with patient today *** minutes. This includes reviewing records, evaluating the patient and coordinating care.  Medication Adjustments/Labs and Tests Ordered: Current medicines are reviewed at  length with the patient today.  Concerns regarding medicines are outlined above.  No orders of the defined types were placed in this encounter.  No orders of the defined types were placed in this encounter.    Signed, Rossie Muskrat. Lalla Brothers, MD, [facility], [facility] 05/07/2021 8:16 AM    Electrophysiology [facility]

## 2021-05-14 ENCOUNTER — Ambulatory Visit: Payer: BC Managed Care – PPO | Admitting: Family Medicine

## 2021-05-21 ENCOUNTER — Encounter: Payer: Self-pay | Admitting: Cardiology

## 2021-05-30 DIAGNOSIS — J029 Acute pharyngitis, unspecified: Secondary | ICD-10-CM | POA: Diagnosis not present

## 2021-05-30 DIAGNOSIS — Z20822 Contact with and (suspected) exposure to covid-19: Secondary | ICD-10-CM | POA: Diagnosis not present

## 2021-05-30 DIAGNOSIS — Z03818 Encounter for observation for suspected exposure to other biological agents ruled out: Secondary | ICD-10-CM | POA: Diagnosis not present

## 2021-06-22 ENCOUNTER — Encounter: Payer: Self-pay | Admitting: Nurse Practitioner

## 2021-06-22 ENCOUNTER — Telehealth: Payer: BC Managed Care – PPO | Admitting: Nurse Practitioner

## 2021-06-22 ENCOUNTER — Other Ambulatory Visit: Payer: Self-pay

## 2021-06-22 DIAGNOSIS — Z8616 Personal history of COVID-19: Secondary | ICD-10-CM | POA: Insufficient documentation

## 2021-06-22 DIAGNOSIS — R5381 Other malaise: Secondary | ICD-10-CM | POA: Insufficient documentation

## 2021-06-22 DIAGNOSIS — R0602 Shortness of breath: Secondary | ICD-10-CM | POA: Insufficient documentation

## 2021-06-22 DIAGNOSIS — R000 Tachycardia, unspecified: Secondary | ICD-10-CM | POA: Insufficient documentation

## 2021-06-22 DIAGNOSIS — Z8709 Personal history of other diseases of the respiratory system: Secondary | ICD-10-CM | POA: Insufficient documentation

## 2021-06-22 DIAGNOSIS — R053 Chronic cough: Secondary | ICD-10-CM | POA: Insufficient documentation

## 2021-06-22 NOTE — Assessment & Plan Note (Signed)
Cough Fatigue Tachycardia:   Stay well hydrated  Stay active  Deep breathing exercises  May take tylenol for fever or pain  Will place referral to pulmonary  Will place referral to EP  Will place referral to PT  Continue Prilosec  Will order Symbicort   Follow up:  Follow up in 3 months or sooner if needed

## 2021-07-20 DIAGNOSIS — F900 Attention-deficit hyperactivity disorder, predominantly inattentive type: Secondary | ICD-10-CM | POA: Diagnosis not present

## 2021-07-20 DIAGNOSIS — F419 Anxiety disorder, unspecified: Secondary | ICD-10-CM | POA: Diagnosis not present

## 2021-07-20 DIAGNOSIS — F3176 Bipolar disorder, in full remission, most recent episode depressed: Secondary | ICD-10-CM | POA: Diagnosis not present

## 2021-09-02 IMAGING — CT CT ANGIO CHEST
2 of 6 series · 18 of 36 positions shown · IV contrast (omnipaque)
Comparison: None.

CLINICAL DATA: Asthma, cough, chest tightness, pleuritic chest pain

EXAM:
CT ANGIOGRAPHY CHEST WITH CONTRAST
TECHNIQUE: Multidetector CT imaging of the chest was performed using the
standard protocol during bolus administration of intravenous
contrast. Multiplanar CT image reconstructions and MIPs were
obtained to evaluate the vascular anatomy.
CONTRAST:  80mL OMNIPAQUE IOHEXOL 350 MG/ML SOLN

[Series 5: thins · axial · 0.62mm/px · z∈[-256,-20]mm · 17 of 266 slices shown]
[im 15/266  lung]
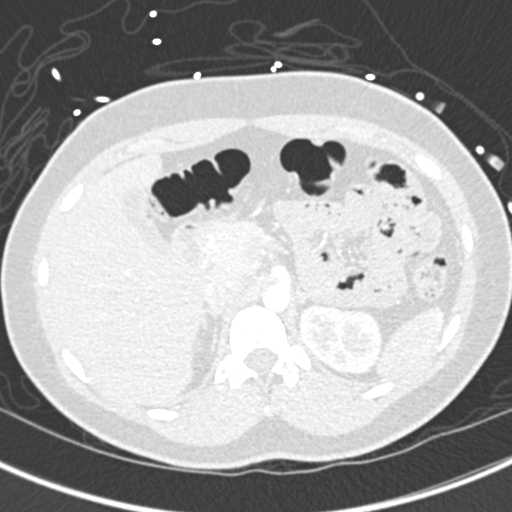
[im 30/266  mediastinal]
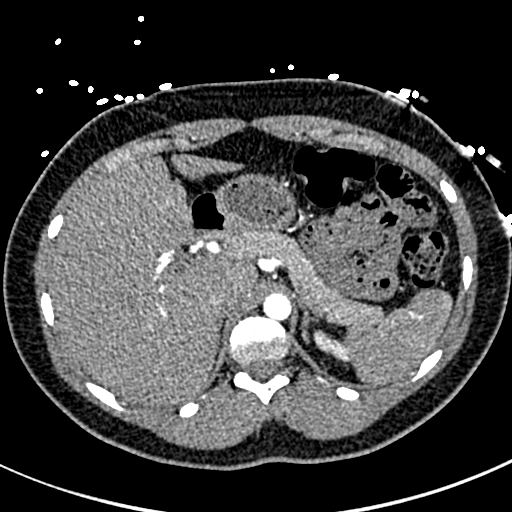
[im 45/266  lung]
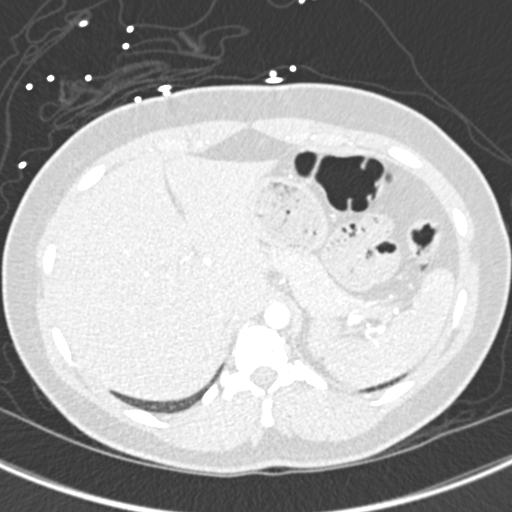
[im 59/266  mediastinal]
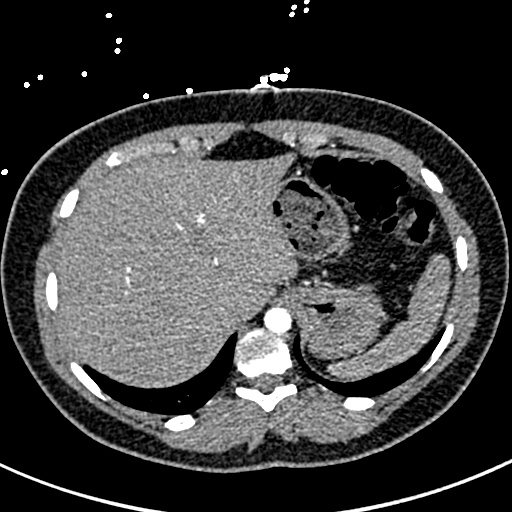
[im 74/266  lung]
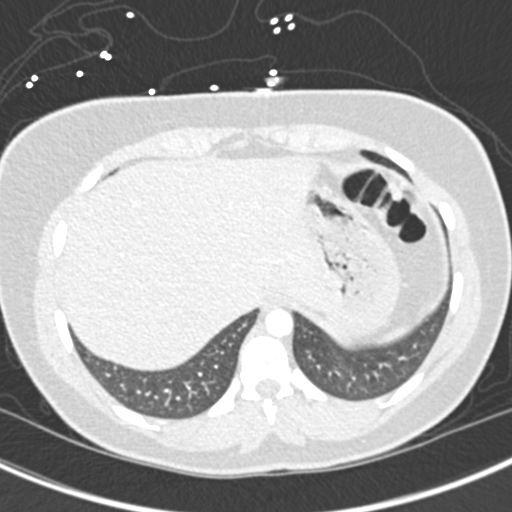
[im 89/266  mediastinal]
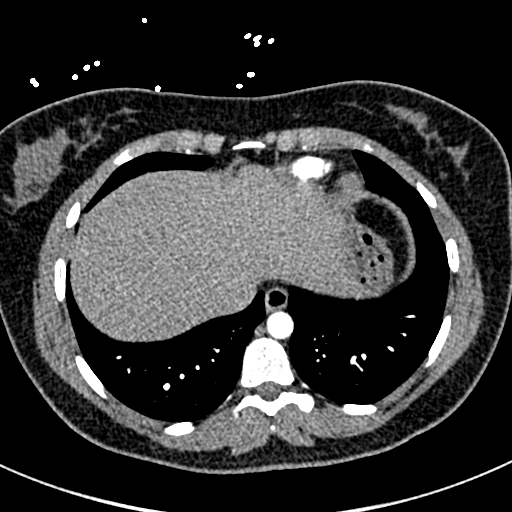
[im 104/266  lung]
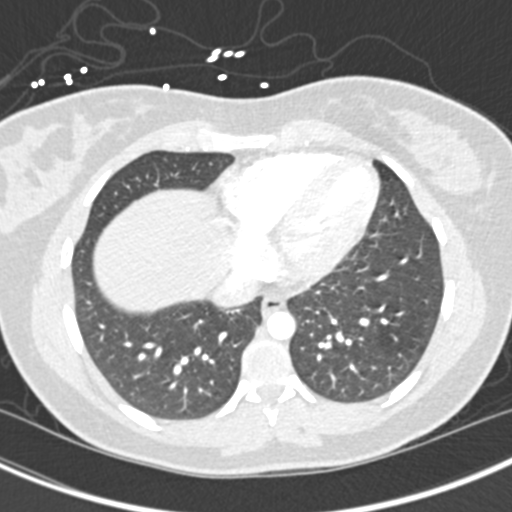
[im 118/266  mediastinal]
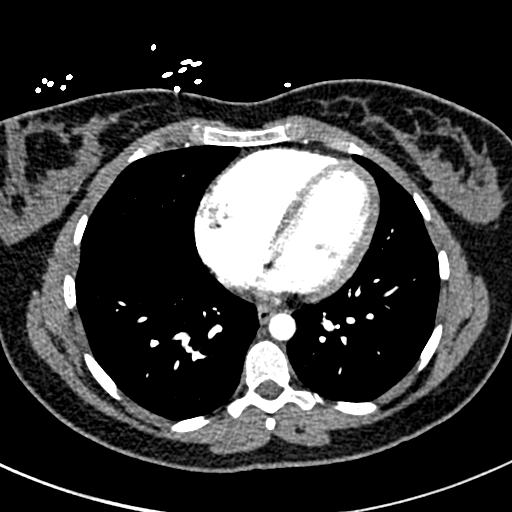
[im 133/266  lung]
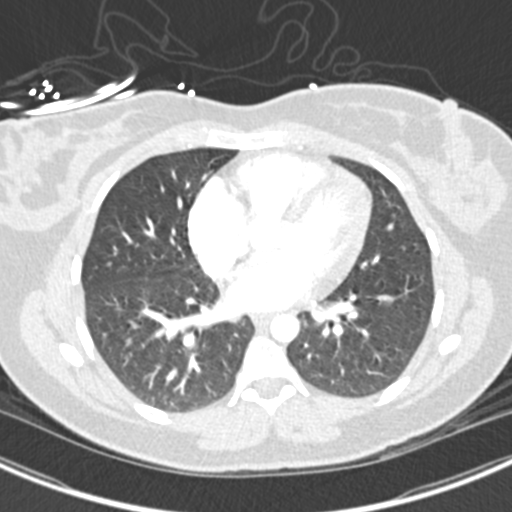
[im 148/266  mediastinal]
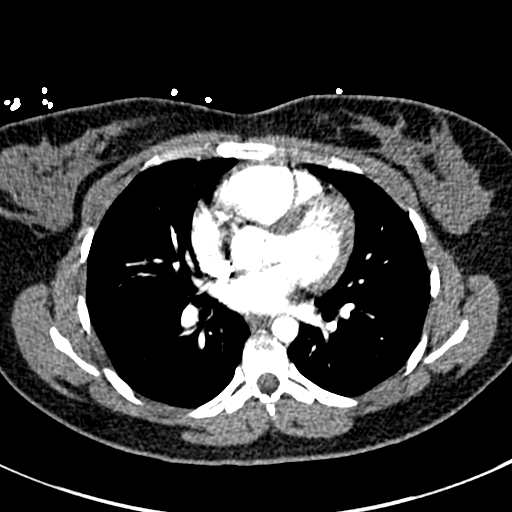
[im 162/266  lung]
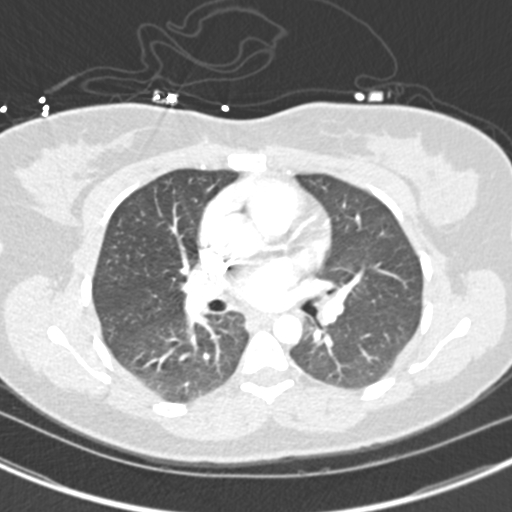
[im 177/266  mediastinal]
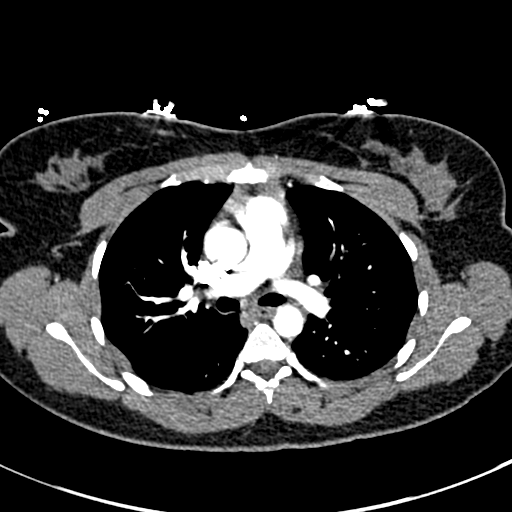
[im 192/266  lung]
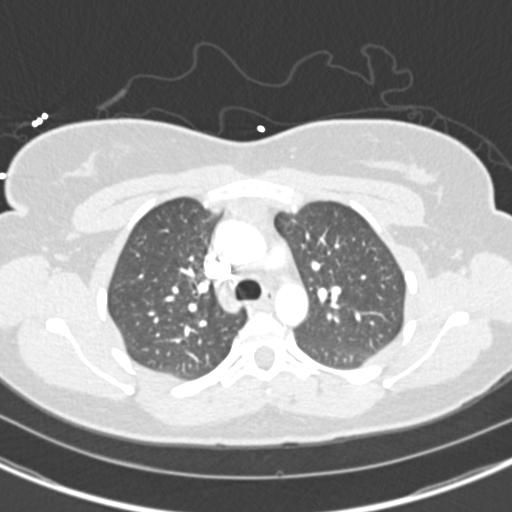
[im 207/266  mediastinal]
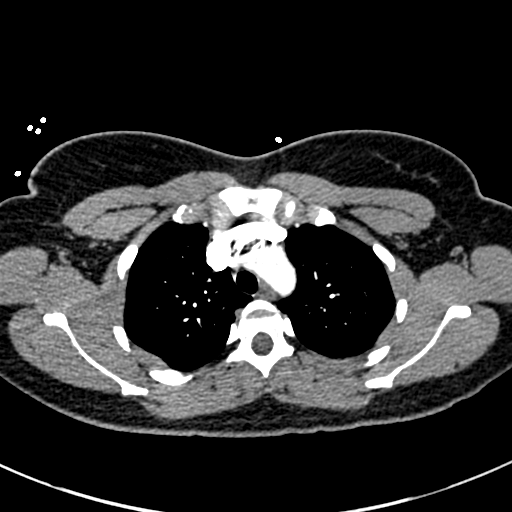
[im 221/266  lung]
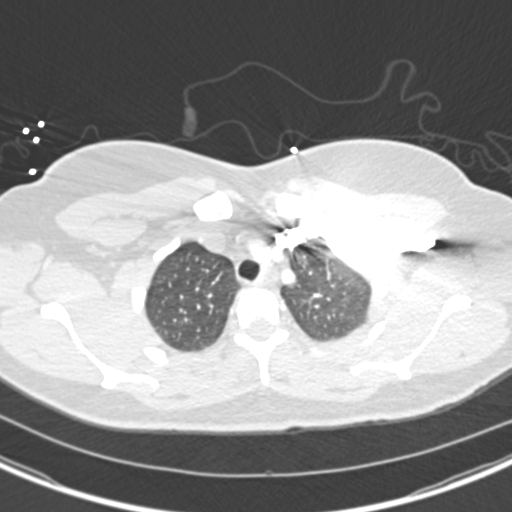
[im 236/266  mediastinal]
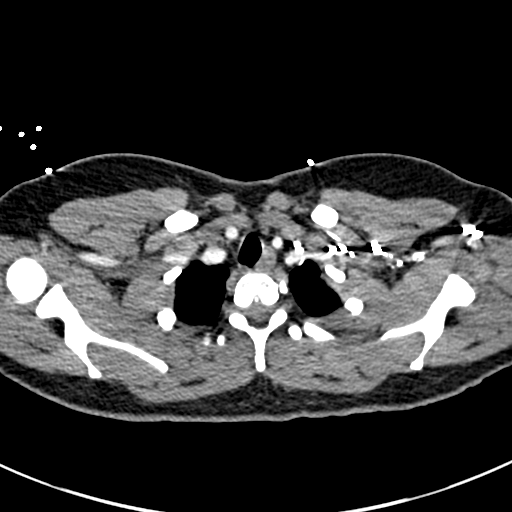
[im 251/266  lung]
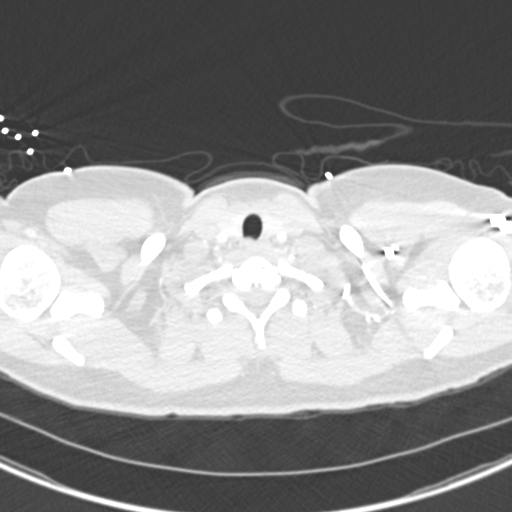

[Series 6: coronal mpr · coronal · 0.58mm/px · 1 of 124 slices shown]
[im 62/124  mediastinal]
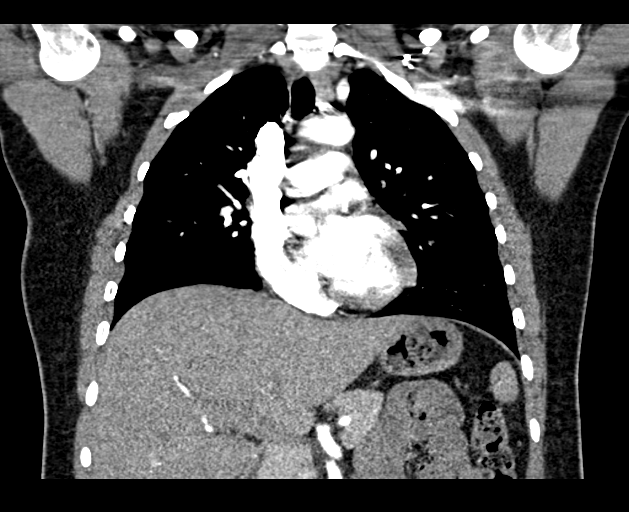

[18 of 36 positions shown; findings below may reference images not displayed]

FINDINGS: Cardiovascular: Satisfactory opacification of the bilateral
pulmonary arteries to the lobar level. No evidence of pulmonary
embolism.

Although not tailored for evaluation of the thoracic aorta, there is
no evidence of thoracic aortic aneurysm or dissection.

The heart is normal in size.  No pericardial effusion.

Mediastinum/Nodes: No suspicious mediastinal lymphadenopathy.

Visualized thyroid is unremarkable.

Lungs/Pleura: Lungs are clear.

No suspicious pulmonary nodules.

No focal consolidation.

No pleural effusion or pneumothorax.

Upper Abdomen: Visualized upper abdomen is unremarkable.

Musculoskeletal: Visualized osseous structures are within normal
limits.

Review of the MIP images confirms the above findings.
IMPRESSION: No evidence of pulmonary embolism.

Negative CT chest.

## 2021-09-14 ENCOUNTER — Encounter: Payer: Self-pay | Admitting: Nurse Practitioner

## 2021-09-16 NOTE — Telephone Encounter (Signed)
Please schedule patient for an appointment. Thanks.

## 2021-09-23 ENCOUNTER — Other Ambulatory Visit: Payer: Self-pay

## 2021-09-23 ENCOUNTER — Encounter: Payer: Self-pay | Admitting: Nurse Practitioner

## 2021-09-23 ENCOUNTER — Ambulatory Visit (INDEPENDENT_AMBULATORY_CARE_PROVIDER_SITE_OTHER): Payer: BC Managed Care – PPO | Admitting: Nurse Practitioner

## 2021-09-23 VITALS — BP 129/79 | HR 106 | Resp 20 | Wt 164.0 lb

## 2021-09-23 DIAGNOSIS — G9339 Other post infection and related fatigue syndromes: Secondary | ICD-10-CM | POA: Diagnosis not present

## 2021-09-23 DIAGNOSIS — M791 Myalgia, unspecified site: Secondary | ICD-10-CM

## 2021-09-23 NOTE — Progress Notes (Signed)
Note for school ?F/u post covid symtoms ?

## 2021-09-23 NOTE — Progress Notes (Signed)
@Patient  ID: , female    DOB: Apr 21, 1996, 26 y.o.   MRN: [id number] ? ?Chief Complaint  ?Patient presents with  ? post covid symtoms  ? ? ?Referring provider: ?No ref. provider found ? ?HPI ? ? ?Patient presents today for post-COVID care clinic visit.  She was diagnosed with COVID in May 2022.  She was sick again this past January but tested negative.  Patient has been seen by me several months ago.  She was ordered physical therapy and PMR but did not do either.  She continues to have body aches and fatigue.Denies f/c/s, n/v/d, hemoptysis, PND, leg swelling. ? ? ? ? ?Allergies  ?Allergen Reactions  ? Peanut-Containing Drug Products Anaphylaxis and Rash  ? Amoxicillin Rash  ? Nickel Dermatitis  ?  asthmarash ? ?asthmarash ?asthmarash ?asthmarash ? ?asthmarash ?asthmarash ?  ? Other Rash  ?  Nuts ?Nickel ?Nuts ?Nickel ?Nuts ?Nickel ?Nuts ?Nickel ?  ? Justicia Adhatoda (Malabar Nut Tree) [Justicia Adhatoda] Rash  ?  All nuts  ? ? ? ?There is no immunization history on file for this patient. ? ?Past Medical History:  ?Diagnosis Date  ? Asthma   ? ? ?Tobacco History: ?Social History  ? ?Tobacco Use  ?Smoking Status Never  ?Smokeless Tobacco Never  ? ?Counseling given: Not Answered ? ? ?Outpatient Encounter Medications as of 09/23/2021  ?Medication Sig  ? albuterol (PROVENTIL) (2.5 MG/3ML) 0.083% nebulizer solution as needed.  ? albuterol (VENTOLIN HFA) 108 (90 Base) MCG/ACT inhaler as needed.  ? beclomethasone (QVAR REDIHALER) 40 MCG/ACT inhaler as needed.  ? buPROPion (WELLBUTRIN XL) 150 MG 24 hr tablet daily.  ? EPINEPHrine (EPIPEN JR) 0.15 MG/0.3ML injection Inject into the muscle as needed.  ? hydrOXYzine (ATARAX/VISTARIL) 25 MG tablet as needed.  ? Lactobacillus-Inulin (CULTURELLE DIGESTIVE DAILY) CAPS Take by mouth daily at 6 (six) AM.  ? lamoTRIgine (LAMICTAL) 200 MG tablet Take 200 mg by mouth daily.  ? LORazepam (ATIVAN) 0.5 MG tablet as needed.  ? methylphenidate (RITALIN) 5 MG tablet Take 7.5-10 mg  by mouth as needed.  ? norgestimate-ethinyl estradiol (ORTHO-CYCLEN) 0.25-35 MG-MCG tablet Take 1 tablet by mouth daily.  ? [DISCONTINUED] budesonide-formoterol (SYMBICORT) 80-4.5 MCG/ACT inhaler Inhale 2 puffs into the lungs 2 (two) times daily.  ? buPROPion (WELLBUTRIN XL) 300 MG 24 hr tablet daily.  ? escitalopram (LEXAPRO) 10 MG tablet Take 10 mg by mouth daily. (Patient not taking: Reported on 09/23/2021)  ? predniSONE (STERAPRED UNI-PAK 21 TAB) 10 MG (21) TBPK tablet Take by mouth daily. (Patient not taking: Reported on 09/23/2021)  ? triamcinolone ointment (KENALOG) 0.1 % Apply topically as needed. (Patient not taking: Reported on 09/23/2021)  ? [DISCONTINUED] budesonide-formoterol (SYMBICORT) 80-4.5 MCG/ACT inhaler Inhale 2 puffs into the lungs 2 (two) times daily.  ? ?No facility-administered encounter medications on file as of 09/23/2021.  ? ? ? ?Review of Systems ? ?Review of Systems  ?Constitutional:  Positive for fatigue.  ?HENT: Negative.    ?Cardiovascular: Negative.   ?Gastrointestinal: Negative.   ?Musculoskeletal:  Positive for myalgias.  ?Allergic/Immunologic: Negative.   ?Neurological: Negative.   ?Psychiatric/Behavioral: Negative.     ? ? ? ?Physical Exam ? ?BP 129/79 (BP Location: Right Arm, Patient Position: Sitting, Cuff Size: Normal)   Pulse (!) 106   Resp 20   Wt 164 lb (74.4 kg)   LMP 09/09/2021   SpO2 97%   BMI 32.03 kg/m?  ? ?Wt Readings from Last 5 Encounters:  ?10/03/21 164 lb (74.4 kg)  ?09/23/21 164 lb (  74.4 kg)  ?03/23/21 149 lb 3.2 oz (67.7 kg)  ?03/18/21 149 lb 12.8 oz (67.9 kg)  ?03/12/21 152 lb (68.9 kg)  ? ? ? ?Physical Exam ?Vitals and nursing note reviewed.  ?Constitutional:   ?   General: She is not in acute distress. ?   Appearance: She is well-developed.  ?Cardiovascular:  ?   Rate and Rhythm: Normal rate and regular rhythm.  ?Pulmonary:  ?   Effort: Pulmonary effort is normal.  ?   Breath sounds: Normal breath sounds.  ?Neurological:  ?   Mental Status: She is alert and  oriented to person, place, and time.  ? ? ? ? ?Assessment & Plan:  ? ?Other post infection and related fatigue syndromes ?- Sedimentation Rate ?- ANA ?- Rheumatoid factor ?- CBC ?- Comprehensive metabolic panel ? ?2. Myalgia ? ?- Sedimentation Rate ?- ANA ?- Rheumatoid factor ?- CBC ?- Comprehensive metabolic panel ?- TSH ? ?Follow up: ? ?Follow up with PCP in 4 weeks or sooner if needed ? ? ? ? ?Ivonne Andrew, NP ?10/05/2021 ? ?

## 2021-09-24 LAB — COMPREHENSIVE METABOLIC PANEL
ALT: 18 IU/L (ref 0–32)
AST: 17 IU/L (ref 0–40)
Albumin/Globulin Ratio: 2.2 (ref 1.2–2.2)
Albumin: 4.6 g/dL (ref 3.9–5.0)
Alkaline Phosphatase: 63 IU/L (ref 44–121)
BUN/Creatinine Ratio: 15 (ref 9–23)
BUN: 10 mg/dL (ref 6–20)
Bilirubin Total: 0.2 mg/dL (ref 0.0–1.2)
CO2: 22 mmol/L (ref 20–29)
Calcium: 9.6 mg/dL (ref 8.7–10.2)
Chloride: 101 mmol/L (ref 96–106)
Creatinine, Ser: 0.67 mg/dL (ref 0.57–1.00)
Globulin, Total: 2.1 g/dL (ref 1.5–4.5)
Glucose: 104 mg/dL — ABNORMAL HIGH (ref 70–99)
Potassium: 4.6 mmol/L (ref 3.5–5.2)
Sodium: 138 mmol/L (ref 134–144)
Total Protein: 6.7 g/dL (ref 6.0–8.5)
eGFR: 125 mL/min/{1.73_m2} (ref >59)

## 2021-09-24 LAB — CBC
Hematocrit: 38.2 % (ref 34.0–46.6)
Hemoglobin: 12.8 g/dL (ref 11.1–15.9)
MCH: 30.3 pg (ref 26.6–33.0)
MCHC: 33.5 g/dL (ref 31.5–35.7)
MCV: 91 fL (ref 79–97)
Platelets: 420 10*3/uL (ref 150–450)
RBC: 4.22 x10E6/uL (ref 3.77–5.28)
RDW: 12.6 % (ref 11.7–15.4)
WBC: 8.1 10*3/uL (ref 3.4–10.8)

## 2021-09-24 LAB — SEDIMENTATION RATE: Sed Rate: 2 mm/hr (ref 0–32)

## 2021-09-24 LAB — RHEUMATOID FACTOR: Rheumatoid fact SerPl-aCnc: <10 IU/mL (ref <14.0)

## 2021-09-24 LAB — TSH: TSH: 1.51 u[IU]/mL (ref 0.450–4.500)

## 2021-09-24 LAB — ANA: Anti Nuclear Antibody (ANA): NEGATIVE

## 2021-09-25 ENCOUNTER — Telehealth: Payer: Self-pay

## 2021-09-25 NOTE — Telephone Encounter (Signed)
-----   Message from Ivonne Andrew, NP sent at 09/25/2021 11:35 AM EDT ----- ?Please have patient return for a follow up visit next week - her glucose came back elevated again. Thanks.  ?

## 2021-09-25 NOTE — Telephone Encounter (Signed)
Pt was called and vm was left, Information has been sent to nurse pool.   

## 2021-10-02 ENCOUNTER — Ambulatory Visit: Payer: BC Managed Care – PPO | Admitting: Nurse Practitioner

## 2021-10-03 ENCOUNTER — Other Ambulatory Visit: Payer: Self-pay

## 2021-10-03 ENCOUNTER — Emergency Department
Admission: EM | Admit: 2021-10-03 | Discharge: 2021-10-03 | Disposition: A | Discharge status: Home / Self Care | Payer: BC Managed Care – PPO | Attending: Emergency Medicine | Admitting: Emergency Medicine

## 2021-10-03 ENCOUNTER — Emergency Department: Payer: BC Managed Care – PPO

## 2021-10-03 DIAGNOSIS — J45909 Unspecified asthma, uncomplicated: Secondary | ICD-10-CM | POA: Diagnosis not present

## 2021-10-03 DIAGNOSIS — R0789 Other chest pain: Secondary | ICD-10-CM | POA: Diagnosis not present

## 2021-10-03 DIAGNOSIS — R079 Chest pain, unspecified: Secondary | ICD-10-CM | POA: Diagnosis not present

## 2021-10-03 DIAGNOSIS — R0602 Shortness of breath: Secondary | ICD-10-CM | POA: Insufficient documentation

## 2021-10-03 LAB — CBC
HCT: 35.7 % — ABNORMAL LOW (ref 36.0–46.0)
Hemoglobin: 12 g/dL (ref 12.0–15.0)
MCH: 29.6 pg (ref 26.0–34.0)
MCHC: 33.6 g/dL (ref 30.0–36.0)
MCV: 88.1 fL (ref 80.0–100.0)
Platelets: 389 10*3/uL (ref 150–400)
RBC: 4.05 MIL/uL (ref 3.87–5.11)
RDW: 12.1 % (ref 11.5–15.5)
WBC: 10.4 10*3/uL (ref 4.0–10.5)
nRBC: 0 % (ref 0.0–0.2)

## 2021-10-03 LAB — BASIC METABOLIC PANEL
Anion gap: 5 (ref 5–15)
BUN: 12 mg/dL (ref 6–20)
CO2: 25 mmol/L (ref 22–32)
Calcium: 8.7 mg/dL — ABNORMAL LOW (ref 8.9–10.3)
Chloride: 108 mmol/L (ref 98–111)
Creatinine, Ser: 0.82 mg/dL (ref 0.44–1.00)
GFR, Estimated: >60 mL/min (ref >60)
Glucose, Bld: 155 mg/dL — ABNORMAL HIGH (ref 70–99)
Potassium: 4.1 mmol/L (ref 3.5–5.1)
Sodium: 138 mmol/L (ref 135–145)

## 2021-10-03 LAB — TROPONIN I (HIGH SENSITIVITY)
Troponin I (High Sensitivity): <2 ng/L (ref <18)
Troponin I (High Sensitivity): <2 ng/L (ref <18)

## 2021-10-03 LAB — POC URINE PREG, ED: Preg Test, Ur: NEGATIVE

## 2021-10-03 NOTE — ED Provider Notes (Signed)
? ?[facility] ?Provider Note ? ? ? Event Date/Time  ? First MD Initiated Contact with Patient 10/03/21 0454   ?  (approximate) ? ? ?History  ? ?Chest Pain and Shortness of Breath ? ? ?HPI ? ?Megan Bernard is a 26 y.o. female who reports a prior medical history of prior episodes of tachycardia, bicuspid valve, and prior episodes of chest pain and shortness of breath with some baseline well-controlled asthma.  She presents for evaluation of several days of intermittent short of breath that has progressively gotten worse.  She has also been having some central chest pain for the last few hours.  Nothing particular makes the symptoms better or worse and she feels like she cannot get enough oxygen.  Similar symptoms have happened in the past and she was previously evaluated for possible pulmonary embolism but that work-up was reassuring.  She continues to take oral contraceptive pills but is on no other recent medications.  She does not smoke.  No recent sinus congestion, sore throat, loss of smell or taste, nor fever.  She is having no numbness nor tingling in her extremities.  No recent immobilizations nor surgeries. ?  ? ? ?Physical Exam  ? ?Triage Vital Signs: ?ED Triage Vitals  ?Enc Vitals Group  ?   BP 10/03/21 0049 129/79  ?   Pulse Rate 10/03/21 0049 (!) 104  ?   Resp 10/03/21 0049 20  ?   Temp 10/03/21 0049 98.7 ?F (37.1 ?C)  ?   Temp Source 10/03/21 0049 Oral  ?   SpO2 10/03/21 0049 98 %  ?   Weight 10/03/21 0051 74.4 kg (164 lb)  ?   Height 10/03/21 0051 1.524 m (5')  ?   Head Circumference --   ?   Peak Flow --   ?   Pain Score 10/03/21 0050 7  ?   Pain Loc --   ?   Pain Edu? --   ?   Excl. in GC? --   ? ? ?Most recent vital signs: ?Vitals:  ? 10/03/21 0310 10/03/21 0531  ?BP: 117/78 111/75  ?Pulse: 92 90  ?Resp: 18 18  ?Temp: 98.8 ?F (37.1 ?C)   ?SpO2: 96% 97%  ? ? ? ?General: Awake, no distress.  ?CV:  Good peripheral perfusion.  Normal heart sounds. ?Resp:  Normal effort.  Speaking  clearly and in full sentences.  No wheezes, rales, nor rhonchi upon auscultation.  Good air movement throughout. ?Abd:  No distention.  No tenderness to palpation. ? ? ?ED Results / Procedures / Treatments  ? ?Labs ?(all labs ordered are listed, but only abnormal results are displayed) ?Labs Reviewed  ?BASIC METABOLIC PANEL - Abnormal; Notable for the following components:  ?    Result Value  ? Glucose, Bld 155 (*)   ? Calcium 8.7 (*)   ? All other components within normal limits  ?CBC - Abnormal; Notable for the following components:  ? HCT 35.7 (*)   ? All other components within normal limits  ?POC URINE PREG, ED  ?TROPONIN I (HIGH SENSITIVITY)  ?TROPONIN I (HIGH SENSITIVITY)  ? ? ? ?EKG ? ?ED ECG REPORT ?ILoleta Rose, the attending physician, personally viewed and interpreted this ECG. ? ?Date: 10/03/2021 ?EKG Time: 00: 55 ?Rate: 99 ?Rhythm: normal sinus rhythm ?QRS Axis: normal ?Intervals: normal ?ST/T Wave abnormalities: normal ?Narrative Interpretation: no evidence of acute ischemia ? ? ? ?RADIOLOGY ?I personally reviewed the patient's chest x-ray and I see no  evidence of acute abnormality.  Radiologist report agrees. ? ? ? ?PROCEDURES: ? ?Critical Care performed: No ? ?.1-3 Lead EKG Interpretation ?Performed by: Loleta Rose, MD ?Authorized by: Loleta Rose, MD  ? ?  Interpretation: normal   ?  ECG rate:  90 ?  ECG rate assessment: normal   ?  Rhythm: sinus rhythm   ?  Ectopy: none   ?  Conduction: normal   ? ? ?MEDICATIONS ORDERED IN ED: ?Medications - No data to display ? ? ?IMPRESSION / MDM / ASSESSMENT AND PLAN / ED COURSE  ?I reviewed the triage vital signs and the nursing notes. ?             ?               ? ?Differential diagnosis includes, but is not limited to, viral illness, bacterial pneumonia, nonspecific chest pain and dyspnea similar to prior symptoms, PE, anxiety, asthma. ? ?The patient was on the cardiac monitor to evaluate for evidence of arrhythmia and/or significant heart rate  changes. ? ?Patient is well-appearing and in no distress.  Speaking easily and comfortably, no sign of dyspnea on exam.  Vital signs are stable and within normal limits with no tachycardia and no hypoxemia.  Although she takes oral contraceptive pills, she is at low risk for PE and has had a PT evaluation in the past. ? ?  I reviewed the medical record and verified, in fact, that she had a CTA chest on 03/12/2021 which was negative for PE.  This is reassuring and given her Wells score of 0, I declined to do additional testing; a D-dimer could be elevated for any 1 of a number of reasons and could lead to unnecessary repeat CTA imaging of a young woman who does not require it.  I am reassured by both her history and her work-up today. ? ?I personally chest x-ray and there is no sign of acute abnormality.  Vital signs are as previously described within normal limits.  Labs ordered in triage include urine pregnancy test, high-sensitivity troponin x2, basic metabolic panel, and CBC. ? ?I reviewed the results of the labs and her urine pregnancy test is negative, high-sensitivity troponin is negative x2, basic metabolic panel is within normal limits, and CBC is within normal limits.  I also personally reviewed her EKG and there is no sign of ischemia. ? ?I went over the reassuring results with the patient and she seems comfortable that nothing appears abnormal at this time.  I encouraged close outpatient follow-up.  She reports that she is in between doctors currently so I provided her with follow-up information both for PCP and cardiology.  I gave my usual and customary return precautions and she understands and agrees with the plan.  The patient does not require hospitalization. ? ? ?FINAL CLINICAL IMPRESSION(S) / ED DIAGNOSES  ? ?Final diagnoses:  ?Shortness of breath  ?Atypical chest pain  ? ? ? ?Rx / DC Orders  ? ?ED Discharge Orders   ? ? None  ? ?  ? ? ? ?Note:  This document was prepared using Dragon voice  recognition software and may include unintentional dictation errors. ?  ?Loleta Rose, MD ?10/03/21 4132 ? ?

## 2021-10-03 NOTE — ED Notes (Signed)
Discharge instructions and follow-up information provided to patient. Patient verbalized understanding. Patient ambulated out to the waiting room with a steady gait. ?

## 2021-10-03 NOTE — Discharge Instructions (Signed)
Your workup in the Emergency Department today was reassuring.  We did not find any specific abnormalities.  We recommend you drink plenty of fluids, take your regular medications and/or any new ones prescribed today, and follow up with the doctor(s) listed in these documents as recommended.  Consider taking ibuprofen 600 mg 3 times a day with meals for at least 5 days and see if this helps. ? ?Return to the Emergency Department if you develop new or worsening symptoms that concern you. ? ?

## 2021-10-03 NOTE — ED Triage Notes (Signed)
Pt states has had shob for several days that is progressively getting worse. Pt states she also has been having jaw pain and chest pain for last 2 hours. Pt states does take BCP.  ?

## 2021-10-03 NOTE — ED Notes (Signed)
ED Provider at bedside. 

## 2021-10-05 ENCOUNTER — Encounter: Payer: Self-pay | Admitting: Nurse Practitioner

## 2021-10-05 DIAGNOSIS — G9339 Other post infection and related fatigue syndromes: Secondary | ICD-10-CM | POA: Insufficient documentation

## 2021-10-05 NOTE — Patient Instructions (Addendum)
1. Other post infection and related fatigue syndromes ? ?- Sedimentation Rate ?- ANA ?- Rheumatoid factor ?- CBC ?- Comprehensive metabolic panel ? ?2. Myalgia ? ?- Sedimentation Rate ?- ANA ?- Rheumatoid factor ?- CBC ?- Comprehensive metabolic panel ?- TSH ? ?Follow up: ? ?Follow up with PCP in 4 weeks or sooner if needed ?

## 2021-10-05 NOTE — Assessment & Plan Note (Signed)
-   Sedimentation Rate ?- ANA ?- Rheumatoid factor ?- CBC ?- Comprehensive metabolic panel ? ?2. Myalgia ? ?- Sedimentation Rate ?- ANA ?- Rheumatoid factor ?- CBC ?- Comprehensive metabolic panel ?- TSH ? ?Follow up: ? ?Follow up with PCP in 4 weeks or sooner if needed ?

## 2021-10-16 DIAGNOSIS — H9209 Otalgia, unspecified ear: Secondary | ICD-10-CM | POA: Diagnosis not present

## 2021-10-16 DIAGNOSIS — H9202 Otalgia, left ear: Secondary | ICD-10-CM | POA: Diagnosis not present

## 2021-10-20 DIAGNOSIS — F3176 Bipolar disorder, in full remission, most recent episode depressed: Secondary | ICD-10-CM | POA: Diagnosis not present

## 2021-10-20 DIAGNOSIS — F419 Anxiety disorder, unspecified: Secondary | ICD-10-CM | POA: Diagnosis not present

## 2021-10-20 DIAGNOSIS — F900 Attention-deficit hyperactivity disorder, predominantly inattentive type: Secondary | ICD-10-CM | POA: Diagnosis not present

## 2021-11-26 DIAGNOSIS — J069 Acute upper respiratory infection, unspecified: Secondary | ICD-10-CM | POA: Diagnosis not present

## 2021-11-26 DIAGNOSIS — B9789 Other viral agents as the cause of diseases classified elsewhere: Secondary | ICD-10-CM | POA: Diagnosis not present

## 2021-11-26 DIAGNOSIS — R059 Cough, unspecified: Secondary | ICD-10-CM | POA: Diagnosis not present

## 2021-11-26 DIAGNOSIS — J028 Acute pharyngitis due to other specified organisms: Secondary | ICD-10-CM | POA: Diagnosis not present

## 2021-12-19 DIAGNOSIS — J069 Acute upper respiratory infection, unspecified: Secondary | ICD-10-CM | POA: Diagnosis not present

## 2021-12-19 DIAGNOSIS — R051 Acute cough: Secondary | ICD-10-CM | POA: Diagnosis not present

## 2021-12-19 DIAGNOSIS — B9689 Other specified bacterial agents as the cause of diseases classified elsewhere: Secondary | ICD-10-CM | POA: Diagnosis not present

## 2022-01-06 ENCOUNTER — Other Ambulatory Visit: Payer: Self-pay | Admitting: Nurse Practitioner

## 2022-01-06 DIAGNOSIS — J4520 Mild intermittent asthma, uncomplicated: Secondary | ICD-10-CM

## 2022-01-06 NOTE — Telephone Encounter (Signed)
Please order a nebulizer machine with tubing for patient. Thanks.

## 2022-01-20 DIAGNOSIS — R300 Dysuria: Secondary | ICD-10-CM | POA: Diagnosis not present

## 2022-01-20 DIAGNOSIS — N949 Unspecified condition associated with female genital organs and menstrual cycle: Secondary | ICD-10-CM | POA: Diagnosis not present

## 2022-01-20 DIAGNOSIS — N939 Abnormal uterine and vaginal bleeding, unspecified: Secondary | ICD-10-CM | POA: Diagnosis not present

## 2022-01-20 DIAGNOSIS — N760 Acute vaginitis: Secondary | ICD-10-CM | POA: Diagnosis not present

## 2022-01-20 DIAGNOSIS — Z3202 Encounter for pregnancy test, result negative: Secondary | ICD-10-CM | POA: Diagnosis not present

## 2022-01-28 DIAGNOSIS — B9689 Other specified bacterial agents as the cause of diseases classified elsewhere: Secondary | ICD-10-CM | POA: Diagnosis not present

## 2022-01-28 DIAGNOSIS — Z6829 Body mass index (BMI) 29.0-29.9, adult: Secondary | ICD-10-CM | POA: Diagnosis not present

## 2022-01-28 DIAGNOSIS — N898 Other specified noninflammatory disorders of vagina: Secondary | ICD-10-CM | POA: Diagnosis not present

## 2022-01-28 DIAGNOSIS — N760 Acute vaginitis: Secondary | ICD-10-CM | POA: Diagnosis not present

## 2022-02-11 DIAGNOSIS — F900 Attention-deficit hyperactivity disorder, predominantly inattentive type: Secondary | ICD-10-CM | POA: Diagnosis not present

## 2022-02-11 DIAGNOSIS — F419 Anxiety disorder, unspecified: Secondary | ICD-10-CM | POA: Diagnosis not present

## 2022-02-11 DIAGNOSIS — F3176 Bipolar disorder, in full remission, most recent episode depressed: Secondary | ICD-10-CM | POA: Diagnosis not present

## 2022-03-08 DIAGNOSIS — R87810 Cervical high risk human papillomavirus (HPV) DNA test positive: Secondary | ICD-10-CM | POA: Diagnosis not present

## 2022-03-08 DIAGNOSIS — R87610 Atypical squamous cells of undetermined significance on cytologic smear of cervix (ASC-US): Secondary | ICD-10-CM | POA: Diagnosis not present

## 2022-03-08 DIAGNOSIS — Z01419 Encounter for gynecological examination (general) (routine) without abnormal findings: Secondary | ICD-10-CM | POA: Diagnosis not present

## 2022-03-08 DIAGNOSIS — Z6830 Body mass index (BMI) 30.0-30.9, adult: Secondary | ICD-10-CM | POA: Diagnosis not present

## 2022-03-19 ENCOUNTER — Telehealth: Payer: BC Managed Care – PPO | Admitting: Physician Assistant

## 2022-03-19 DIAGNOSIS — J4541 Moderate persistent asthma with (acute) exacerbation: Secondary | ICD-10-CM | POA: Diagnosis not present

## 2022-03-19 MED ORDER — PREDNISONE 20 MG PO TABS
40.0000 mg | ORAL_TABLET | Freq: Every day | ORAL | 0 refills | Status: DC
Start: 2022-03-19 — End: 2022-06-26

## 2022-03-19 NOTE — Progress Notes (Signed)
Virtual Visit Consent   Megan Bernard, you are scheduled for a virtual visit with a [facility] provider today. Just as with appointments in the office, your consent must be obtained to participate. Your consent will be active for this visit and any virtual visit you may have with one of our providers in the next 365 days. If you have a MyChart account, a copy of this consent can be sent to you electronically.  As this is a virtual visit, video technology does not allow for your provider to perform a traditional examination. This may limit your provider's ability to fully assess your condition. If your provider identifies any concerns that need to be evaluated in person or the need to arrange testing (such as labs, EKG, etc.), we will make arrangements to do so. Although advances in technology are sophisticated, we cannot ensure that it will always work on either your end or our end. If the connection with a video visit is poor, the visit may have to be switched to a telephone visit. With either a video or telephone visit, we are not always able to ensure that we have a secure connection.  By engaging in this virtual visit, you consent to the provision of healthcare and authorize for your insurance to be billed (if applicable) for the services provided during this visit. Depending on your insurance coverage, you may receive a charge related to this service.  I need to obtain your verbal consent now. Are you willing to proceed with your visit today? Megan Bernard has provided verbal consent on 03/19/2022 for a virtual visit (video or telephone). Megan Bernard  Date: 03/19/2022 6:14 PM  Virtual Visit via Video Note   I, Megan Bernard, connected with  Megan Bernard  ([id number], 03/10/1996) on 03/19/22 at  6:00 PM EDT by a video-enabled telemedicine application and verified that I am speaking with the correct person using two identifiers.  Location: Patient: Virtual Visit Location  Patient: Home Provider: Virtual Visit Location Provider: Home Office   I discussed the limitations of evaluation and management by telemedicine and the availability of in person appointments. The patient expressed understanding and agreed to proceed.    History of Present Illness: Megan Bernard is a 26 y.o. who identifies as a female who was assigned female at birth, and is being seen today for asthma.  HPI: Asthma She complains of chest tightness, cough and difficulty breathing. There is no hoarse voice, shortness of breath or sputum production. This is a new problem. The current episode started in the past 7 days. The problem occurs daily. The problem has been gradually worsening. The cough is non-productive. Associated symptoms include malaise/fatigue. Pertinent negatives include no chest pain, dyspnea on exertion, fever or headaches. Exacerbated by: smoke. Her symptoms are alleviated by beta-agonist and OTC cough suppressant. She reports no improvement on treatment. Her past medical history is significant for asthma.      Problems:  Patient Active Problem List   Diagnosis Date Noted   Other post infection and related fatigue syndromes 10/05/2021   History of COVID-19 06/22/2021   Tachycardia 06/22/2021   Physical deconditioning 06/22/2021   History of asthma 06/22/2021   Shortness of breath 06/22/2021   Chronic cough 06/22/2021    Allergies:  Allergies  Allergen Reactions   Peanut-Containing Drug Products Anaphylaxis and Rash   Amoxicillin Rash   Nickel Dermatitis    asthmarash  asthmarash asthmarash asthmarash  asthmarash asthmarash    Other Rash  Nuts Nickel Nuts Nickel Nuts Nickel Nuts Nickel    Justicia Adhatoda ([place]abar Nut Tree) [Justicia Adhatoda] Rash    All nuts   Medications:  Current Outpatient Medications:    predniSONE (DELTASONE) 20 MG tablet, Take 2 tablets (40 mg total) by mouth daily with breakfast., Disp: 14 tablet, Rfl: 0   albuterol  (PROVENTIL) (2.5 MG/3ML) 0.083% nebulizer solution, as needed., Disp: , Rfl:    albuterol (VENTOLIN HFA) 108 (90 Base) MCG/ACT inhaler, as needed., Disp: , Rfl:    beclomethasone (QVAR REDIHALER) 40 MCG/ACT inhaler, as needed., Disp: , Rfl:    buPROPion (WELLBUTRIN XL) 150 MG 24 hr tablet, daily., Disp: , Rfl:    buPROPion (WELLBUTRIN XL) 300 MG 24 hr tablet, daily., Disp: , Rfl:    EPINEPHrine (EPIPEN JR) 0.15 MG/0.3ML injection, Inject into the muscle as needed., Disp: , Rfl:    escitalopram (LEXAPRO) 10 MG tablet, Take 10 mg by mouth daily. (Patient not taking: Reported on 09/23/2021), Disp: , Rfl:    hydrOXYzine (ATARAX/VISTARIL) 25 MG tablet, as needed., Disp: , Rfl:    Lactobacillus-Inulin (CULTURELLE DIGESTIVE DAILY) CAPS, Take by mouth daily at 6 (six) AM., Disp: , Rfl:    lamoTRIgine (LAMICTAL) 200 MG tablet, Take 200 mg by mouth daily., Disp: , Rfl:    LORazepam (ATIVAN) 0.5 MG tablet, as needed., Disp: , Rfl:    methylphenidate (RITALIN) 5 MG tablet, Take 7.5-10 mg by mouth as needed., Disp: , Rfl:    norgestimate-ethinyl estradiol (ORTHO-CYCLEN) 0.25-35 MG-MCG tablet, Take 1 tablet by mouth daily., Disp: , Rfl:    triamcinolone ointment (KENALOG) 0.1 %, Apply topically as needed. (Patient not taking: Reported on 09/23/2021), Disp: , Rfl:   Observations/Objective: Patient is well-developed, well-nourished in no acute distress.  Resting comfortably at home.  Head is normocephalic, atraumatic.  No labored breathing.  Speech is clear and coherent with logical content.  Patient is alert and oriented at baseline.  Dry cough heard a few times  Assessment and Plan: 1. Moderate persistent asthma with acute exacerbation - predniSONE (DELTASONE) 20 MG tablet; Take 2 tablets (40 mg total) by mouth daily with breakfast.  Dispense: 14 tablet; Refill: 0  - Asthma exacerbation from being in an environment with smoke - Continue inhalers as prescribed - Prednisone burst for exacerbation - Use  nebulizer as needed - Letter for apartment complex to move her to smoke-free environment - Seek in person evaluation if symptoms worsen or fail to improve  Follow Up Instructions: I discussed the assessment and treatment plan with the patient. The patient was provided an opportunity to ask questions and all were answered. The patient agreed with the plan and demonstrated an understanding of the instructions.  A copy of instructions were sent to the patient via MyChart unless otherwise noted below.    The patient was advised to call back or seek an in-person evaluation if the symptoms worsen or if the condition fails to improve as anticipated.  Time:  I spent 10 minutes with the patient via telehealth technology discussing the above problems/concerns.    Megan Bernard

## 2022-03-19 NOTE — Patient Instructions (Signed)
Megan Bernard, thank you for joining Megan Loveless, PA-C for today's virtual visit.  While this provider is not your primary care provider (PCP), if your PCP is located in our provider database this encounter information will be shared with them immediately following your visit.  Consent: (Patient) Megan Bernard provided verbal consent for this virtual visit at the beginning of the encounter.  Current Medications:  Current Outpatient Medications:    predniSONE (DELTASONE) 20 MG tablet, Take 2 tablets (40 mg total) by mouth daily with breakfast., Disp: 14 tablet, Rfl: 0   albuterol (PROVENTIL) (2.5 MG/3ML) 0.083% nebulizer solution, as needed., Disp: , Rfl:    albuterol (VENTOLIN HFA) 108 (90 Base) MCG/ACT inhaler, as needed., Disp: , Rfl:    beclomethasone (QVAR REDIHALER) 40 MCG/ACT inhaler, as needed., Disp: , Rfl:    buPROPion (WELLBUTRIN XL) 150 MG 24 hr tablet, daily., Disp: , Rfl:    buPROPion (WELLBUTRIN XL) 300 MG 24 hr tablet, daily., Disp: , Rfl:    EPINEPHrine (EPIPEN JR) 0.15 MG/0.3ML injection, Inject into the muscle as needed., Disp: , Rfl:    escitalopram (LEXAPRO) 10 MG tablet, Take 10 mg by mouth daily. (Patient not taking: Reported on 09/23/2021), Disp: , Rfl:    hydrOXYzine (ATARAX/VISTARIL) 25 MG tablet, as needed., Disp: , Rfl:    Lactobacillus-Inulin (CULTURELLE DIGESTIVE DAILY) CAPS, Take by mouth daily at 6 (six) AM., Disp: , Rfl:    lamoTRIgine (LAMICTAL) 200 MG tablet, Take 200 mg by mouth daily., Disp: , Rfl:    LORazepam (ATIVAN) 0.5 MG tablet, as needed., Disp: , Rfl:    methylphenidate (RITALIN) 5 MG tablet, Take 7.5-10 mg by mouth as needed., Disp: , Rfl:    norgestimate-ethinyl estradiol (ORTHO-CYCLEN) 0.25-35 MG-MCG tablet, Take 1 tablet by mouth daily., Disp: , Rfl:    triamcinolone ointment (KENALOG) 0.1 %, Apply topically as needed. (Patient not taking: Reported on 09/23/2021), Disp: , Rfl:    Medications ordered in this encounter:  Meds ordered this  encounter  Medications   predniSONE (DELTASONE) 20 MG tablet    Sig: Take 2 tablets (40 mg total) by mouth daily with breakfast.    Dispense:  14 tablet    Refill:  0    Order Specific Question:   Supervising Provider    Answer:   Eber Hong [[id number]]     *If you need refills on other medications prior to your next appointment, please contact your pharmacy*  Follow-Up: Call back or seek an in-person evaluation if the symptoms worsen or if the condition fails to improve as anticipated.  Other Instructions Asthma, Adult  Asthma is a long-term (chronic) condition that causes recurrent episodes in which the lower airways in the lungs become tight and narrow. The narrowing is caused by inflammation and tightening of the smooth muscle around the lower airways. Asthma episodes, also called asthma attacks or asthma flares, may cause coughing, making high-pitched whistling sounds when you breathe, most often when you breathe out (wheezing), shortness of breath, and chest pain. The airways may produce extra mucus caused by the inflammation and irritation. During an attack, it can be difficult to breathe. Asthma attacks can range from minor to life-threatening. Asthma cannot be cured, but medicines and lifestyle changes can help control it and treat acute attacks. It is important to keep your asthma well controlled so the condition does not interfere with your daily life. What are the causes? This condition is believed to be caused by inherited (genetic) and environmental factors, but  its exact cause is not known. What can trigger an asthma attack? Many things can bring on an asthma attack or make symptoms worse. These triggers are different for every person. Common triggers include: Allergens and irritants like mold, dust, pet dander, cockroaches, pollen, air pollution, and chemical odors. Cigarette smoke. Weather changes and cold air. Stress and strong emotional responses such as crying or  laughing hard. Certain medications such as aspirin or beta blockers. Infections and inflammatory conditions, such as the flu, a cold, pneumonia, or inflammation of the nasal membranes (rhinitis). Gastroesophageal reflux disease (GERD). What are the signs or symptoms? Symptoms may occur right after exposure to an asthma trigger or hours later and can vary by person. Common signs and symptoms include: Wheezing. Trouble breathing (shortness of breath). Excessive nighttime or early morning coughing. Chest tightness. Tiredness (fatigue) with minimal activity. Difficulty talking in complete sentences. Poor exercise tolerance. How is this diagnosed? This condition is diagnosed based on: A physical exam and your medical history. Tests, which may include: Lung function studies to evaluate the flow of air in your lungs. Allergy tests. Imaging tests, such as X-rays. How is this treated? There is no cure, but symptoms can be controlled with proper treatment. Treatment usually involves: Identifying and avoiding your asthma triggers. Inhaled medicines. Two types are commonly used to treat asthma, depending on severity: Controller medicines. These help prevent asthma symptoms from occurring. They are taken every day. Fast-acting reliever or rescue medicines. These quickly relieve asthma symptoms. They are used as needed and provide short-term relief. Using other medicines, such as: Allergy medicines, such as antihistamines, if your asthma attacks are triggered by allergens. Immune medicines (immunomodulators). These are medicines that help control the immune system. Using supplemental oxygen. This is only needed during a severe episode. Creating an asthma action plan. An asthma action plan is a written plan for managing and treating your asthma attacks. This plan includes: A list of your asthma triggers and how to avoid them. Information about when medicines should be taken and when their dosage  should be changed. Instructions about using a device called a peak flow meter. A peak flow meter measures how well the lungs are working and the severity of your asthma. It helps you monitor your condition. Follow these instructions at home: Take over-the-counter and prescription medicines only as told by your health care provider. Stay up to date on all vaccinations as recommended by your healthcare provider, including vaccines for the flu and pneumonia. Use a peak flow meter and keep track of your peak flow readings. Understand and use your asthma action plan to address any asthma flares. Do not smoke or allow anyone to smoke in your home. Contact a health care provider if: You have wheezing, shortness of breath, or a cough that is not responding to medicines. Your medicines are causing side effects, such as a rash, itching, swelling, or trouble breathing. You need to use a reliever medicine more than 2-3 times a week. Your peak flow reading is still at 50-79% of your personal best after following your action plan for 1 hour. You have a fever and shortness of breath. Get help right away if: You are getting worse and do not respond to treatment during an asthma attack. You are short of breath when at rest or when doing very little physical activity. You have difficulty eating, drinking, or talking. You have chest pain or tightness. You develop a fast heartbeat or palpitations. You have a bluish color to your  lips or fingernails. You are light-headed or dizzy, or you faint. Your peak flow reading is less than 50% of your personal best. You feel too tired to breathe normally. These symptoms may be an emergency. Get help right away. Call 911. Do not wait to see if the symptoms will go away. Do not drive yourself to the hospital. Summary Asthma is a long-term (chronic) condition that causes recurrent episodes in which the airways become tight and narrow. Asthma episodes, also called asthma  attacks or asthma flares, can cause coughing, wheezing, shortness of breath, and chest pain. Asthma cannot be cured, but medicines and lifestyle changes can help keep it well controlled and prevent asthma flares. Make sure you understand how to avoid triggers and how and when to use your medicines. Asthma attacks can range from minor to life-threatening. Get help right away if you have an asthma attack and do not respond to treatment with your usual rescue medicines. This information is not intended to replace advice given to you by your health care provider. Make sure you discuss any questions you have with your health care provider. Document Revised: 04/15/2021 Document Reviewed: 04/06/2021 Elsevier Patient Education  2023 Elsevier Inc.    If you have been instructed to have an in-person evaluation today at a local Urgent Care facility, please use the link below. It will take you to a list of all of our available [facility], including address, phone number and hours of operation. Please do not delay care.  [facility]  If you or a family member do not have a primary care provider, use the link below to schedule a visit and establish care. When you choose a [facility] primary care physician or advanced practice provider, you gain a long-term partner in health. Find a Primary Care Provider  Learn more about [facility] in-office and virtual care options: [facility] - Get Care Now

## 2022-03-24 ENCOUNTER — Telehealth: Payer: Self-pay | Admitting: Nurse Practitioner

## 2022-03-24 NOTE — Telephone Encounter (Signed)
Copied from CRM [id number]. Topic: General - Other >> Mar 19, 2022  2:08 PM Megan Bernard wrote: Reason for CRM: Pt is trying to locate Angus Seller whom she saw at the [facility] on 09-23-2021 regarding a letter she needs  Please reference 9-8 TE and assist further

## 2022-03-26 IMAGING — CR DG CHEST 2V
2 series · 2 of 2 positions shown · non-contrast
Comparison: CTA chest 03/12/2021

CLINICAL DATA: Chest pain and shortness of breath.

EXAM:
CHEST - 2 VIEW

[chest pa]
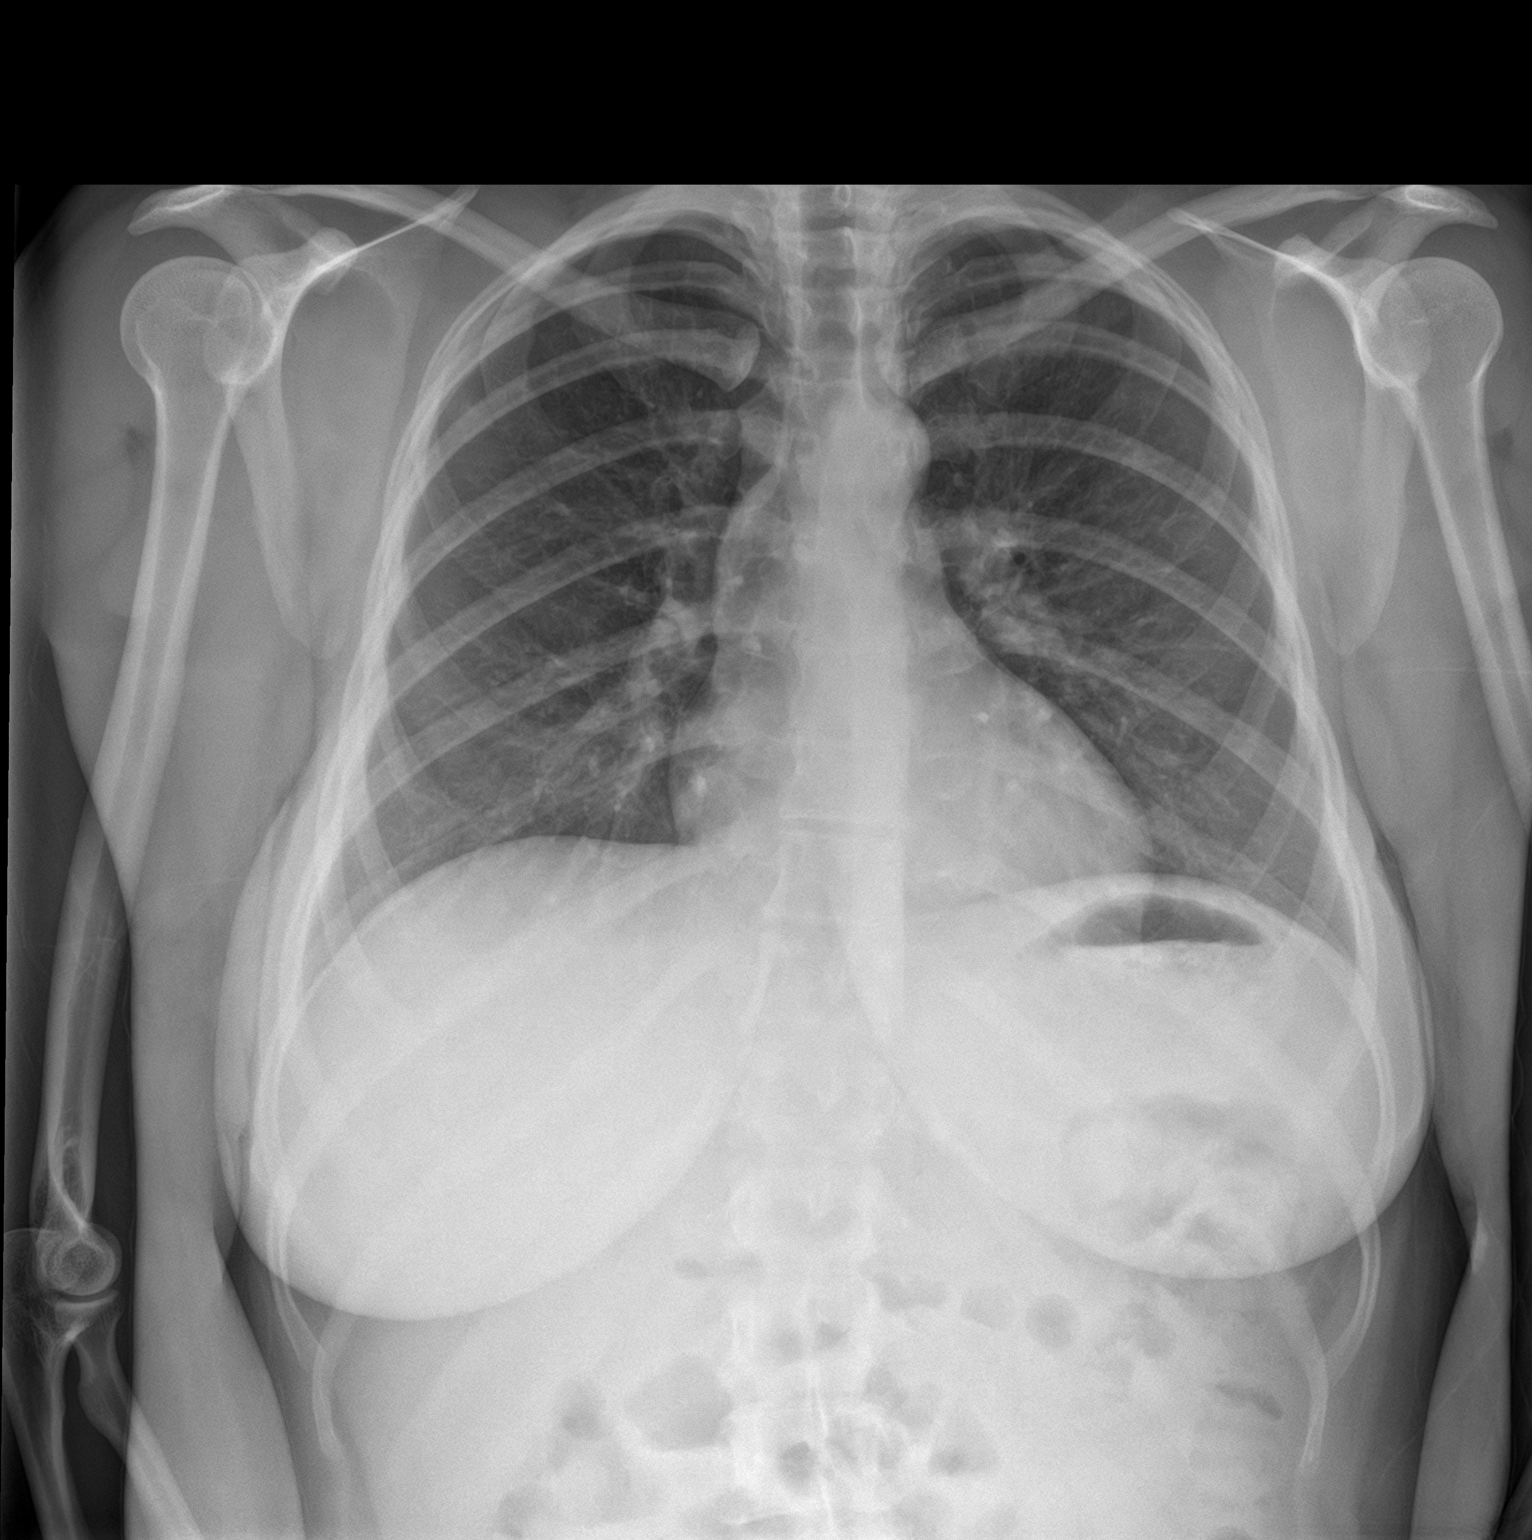

[chest lat]
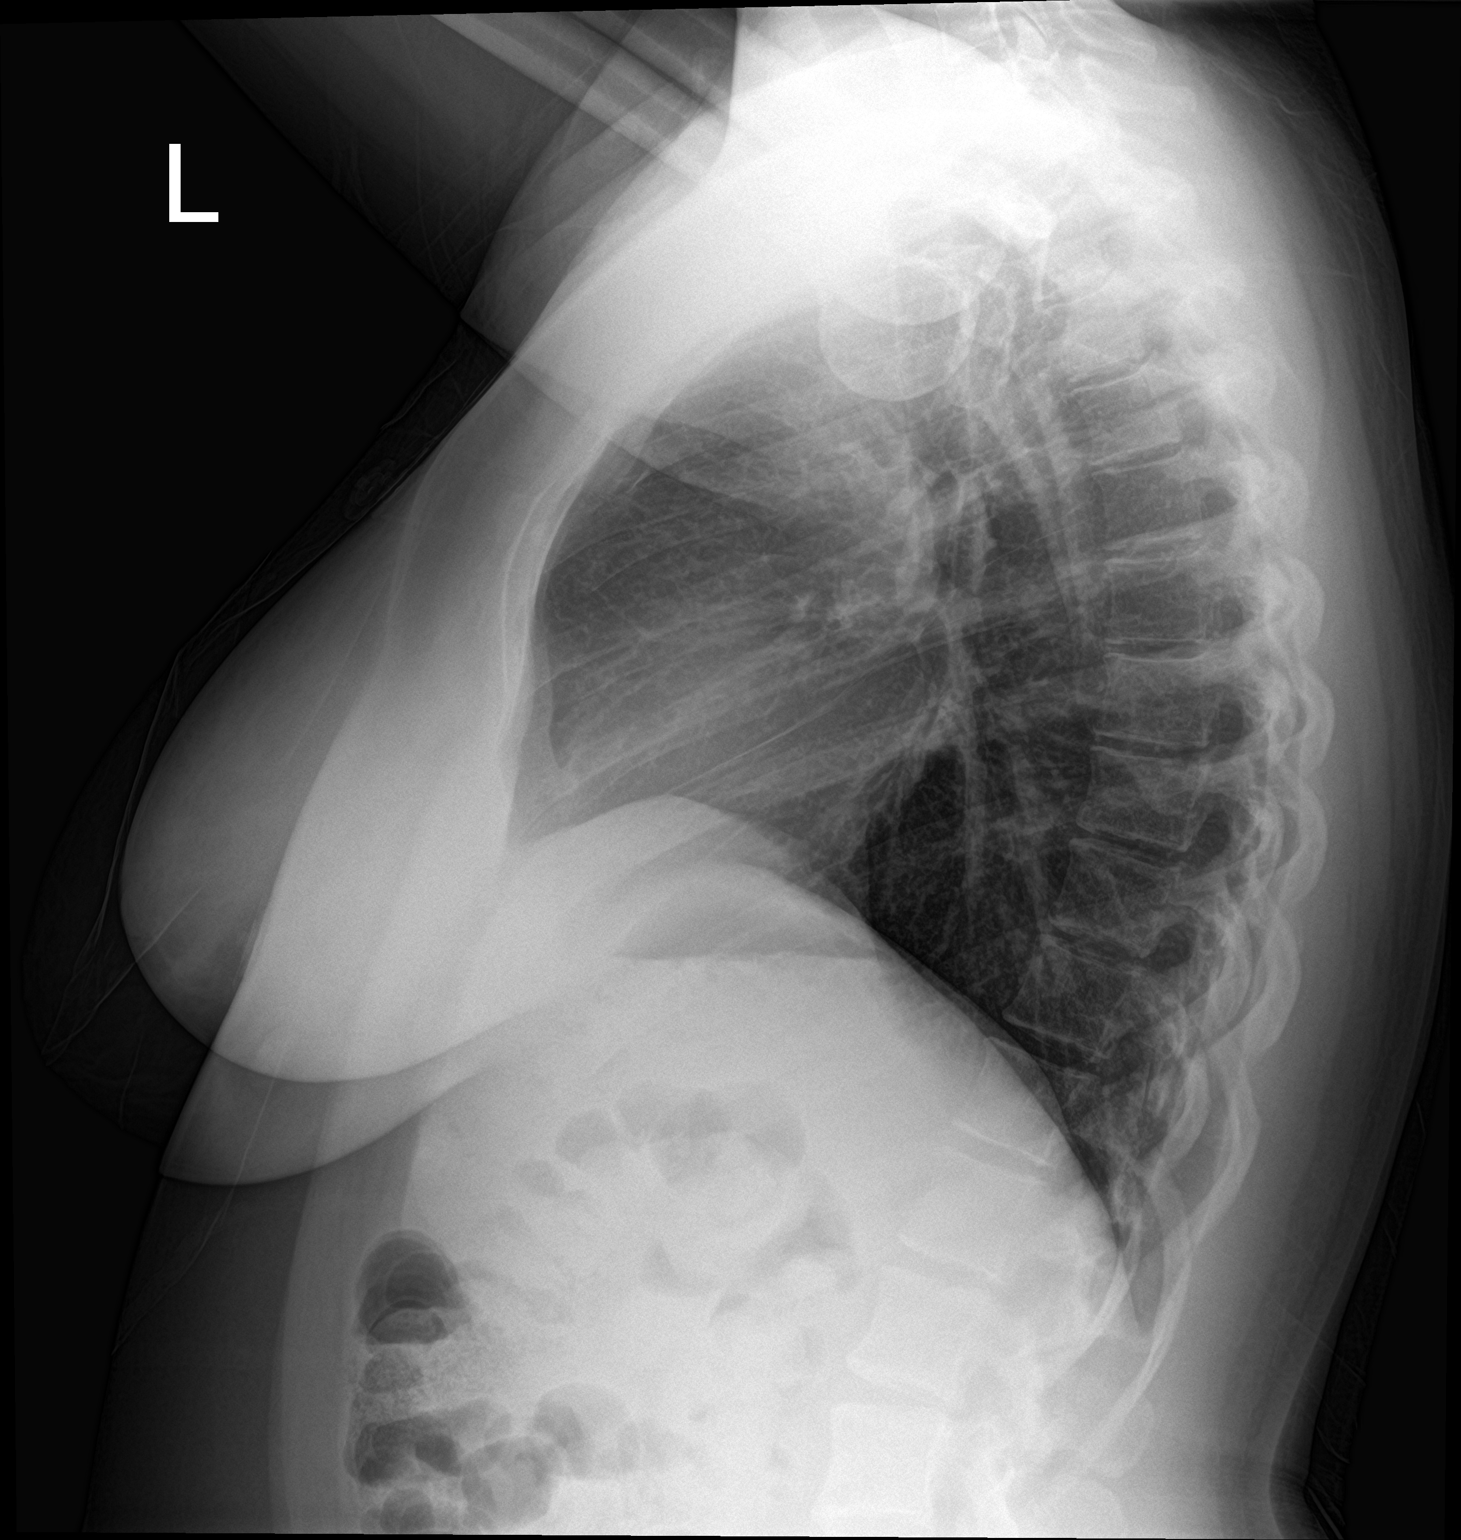

[2 of 2 positions shown; findings below may reference images not displayed]

FINDINGS: The heart size and mediastinal contours are within normal limits.
Both lungs are clear. The visualized skeletal structures are
unremarkable.
IMPRESSION: No active cardiopulmonary disease.  Stable chest.

## 2022-03-31 NOTE — Telephone Encounter (Signed)
This is a post covid patient. Please get her an appointment set up if needed. Thanks.

## 2022-04-10 ENCOUNTER — Ambulatory Visit (HOSPITAL_COMMUNITY)
Admission: RE | Admit: 2022-04-10 | Discharge: 2022-04-10 | Disposition: A | Discharge status: Home / Self Care | Payer: BC Managed Care – PPO | Source: Ambulatory Visit | Attending: Internal Medicine | Admitting: Internal Medicine

## 2022-04-10 ENCOUNTER — Encounter (HOSPITAL_COMMUNITY): Payer: Self-pay

## 2022-04-10 VITALS — BP 117/74 | HR 96 | Temp 98.7°F | Resp 16

## 2022-04-10 DIAGNOSIS — H60391 Other infective otitis externa, right ear: Secondary | ICD-10-CM

## 2022-04-10 DIAGNOSIS — H6121 Impacted cerumen, right ear: Secondary | ICD-10-CM

## 2022-04-10 MED ORDER — OFLOXACIN 0.3 % OT SOLN: 10.0000 [drp] | Freq: Every day | OTIC | 0 refills | Status: AC

## 2022-04-10 NOTE — ED Provider Notes (Signed)
[facility]    CSN: [id number] Arrival date & time: 04/10/22  1545      History   Chief Complaint Chief Complaint  Patient presents with   Otalgia    HPI Megan Bernard is a 26 y.o. female.   Patient presents urgent care for evaluation of right ear fullness and pain to the right ear that started a couple of nights ago, improved, and returned yesterday.  Reports decreased hearing and muffled sound to the right ear.  No tinnitus or recent known foreign bodies to the ear.  No fever/chills, URI symptoms, dizziness, headache, or lightheadedness.  No recent swimming reported.  She has not noticed any ear drainage from her ear and has not attempted any use of over-the-counter medications prior to arrival to urgent care.  Denies use of Q-tips to the ear although states that she used to use Q-tips after showering to remove earwax from the ear.   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 No pertinent surgical history.  OB History   No obstetric history on file.      Home Medications    Prior to Admission medications   Medication Sig Start Date End Date Taking? Authorizing Provider  ofloxacin (FLOXIN) 0.3 % OTIC solution Place 10 drops into the right ear daily. Use 10 drops 1 time daily to the right ear for the next 7 days. 04/10/22  Yes Carlisle Beers, FNP  albuterol (PROVENTIL) (2.5 MG/3ML) 0.083% nebulizer solution as needed. 03/11/14   [provider]  albuterol (VENTOLIN HFA) 108 (90 Base) MCG/ACT inhaler as needed. 05/31/17   [provider]  beclomethasone (QVAR REDIHALER) 40 MCG/ACT inhaler as  needed. 11/03/20   [provider]  buPROPion (WELLBUTRIN XL) 150 MG 24 hr tablet daily. 06/29/17   [provider]  buPROPion (WELLBUTRIN XL) 300 MG 24 hr tablet daily. 02/06/18   [provider]  EPINEPHrine (EPIPEN JR) 0.15 MG/0.3ML injection Inject into the muscle as needed.    [provider]  escitalopram (LEXAPRO) 10 MG tablet Take 10 mg by mouth daily. Patient not taking: Reported on 09/23/2021    [provider]  hydrOXYzine (ATARAX/VISTARIL) 25 MG tablet as needed. 03/15/16   [provider]  Lactobacillus-Inulin (CULTURELLE DIGESTIVE DAILY) CAPS Take by mouth daily at 6 (six) AM.    [provider]  lamoTRIgine (LAMICTAL) 200 MG tablet Take 200 mg by mouth daily.    [provider]  LORazepam (ATIVAN) 0.5 MG tablet as needed. 07/23/19   [provider]  methylphenidate (RITALIN) 5 MG tablet Take 7.5-10 mg by mouth as needed.    [provider]  norgestimate-ethinyl estradiol (ORTHO-CYCLEN) 0.25-35 MG-MCG tablet Take 1 tablet by mouth daily. 05/28/16   [provider]  predniSONE (DELTASONE) 20 MG tablet Take 2 tablets (40 mg total) by mouth daily with breakfast. 03/19/22   [name], PA-C  triamcinolone ointment (KENALOG) 0.1 % Apply topically as needed. Patient not taking: Reported on 09/23/2021 01/16/14   [provider]    Family History No family history on file.  Social History Social History  Tobacco Use   Smoking status: Never   Smokeless tobacco: Never  Vaping Use   Vaping Use: Never used  Substance Use Topics   Alcohol use: Not Currently   Drug use: Not Currently     Allergies   Peanut-containing drug products, Amoxicillin, Nickel, Other, and Justicia adhatoda (malabar nut tree) [justicia adhatoda]   Review of Systems Review of Systems  HENT:  Positive for ear pain.   Per HPI   Physical Exam Triage Vital Signs ED Triage Vitals  Enc Vitals  Group     BP 04/10/22 1649 117/74     Pulse Rate 04/10/22 1649 96     Resp 04/10/22 1649 16     Temp 04/10/22 1649 98.7 F (37.1 C)     Temp Source 04/10/22 1649 Oral     SpO2 04/10/22 1649 98 %     Weight --      Height --      Head Circumference --      Peak Flow --      Pain Score 04/10/22 1648 6     Pain Loc --      Pain Edu? --      Excl. in [place]? --    No data found.  Updated Vital Signs BP 117/74 (BP Location: Left Arm)   Pulse 96   Temp 98.7 F (37.1 C) (Oral)   Resp 16   LMP 04/10/2022 (Exact Date)   SpO2 98%   Visual Acuity Right Eye Distance:   Left Eye Distance:   Bilateral Distance:    Right Eye Near:   Left Eye Near:    Bilateral Near:     Physical Exam Vitals and nursing note reviewed.  Constitutional:      Appearance: She is not ill-appearing or toxic-appearing.  HENT:     Head: Normocephalic and atraumatic.     Right Ear: External ear normal. Decreased hearing noted. Drainage present. There is impacted cerumen.     Left Ear: Hearing, tympanic membrane, ear canal and external ear normal.     Ears:     Comments: Purulent thick drainage present to the right ear canal on second assessment after flushing with ear lavage consistent with otitis externa infection.  Tympanic membrane appears intact.     Nose: Nose normal.     Mouth/Throat:     Lips: Pink.  Eyes:     General: Lids are normal. Vision grossly intact. Gaze aligned appropriately.     Extraocular Movements: Extraocular movements intact.     Conjunctiva/sclera: Conjunctivae normal.  Pulmonary:     Effort: Pulmonary effort is normal.  Musculoskeletal:     Cervical back: Neck supple.  Skin:    General: Skin is warm and dry.     Capillary Refill: Capillary refill takes less than 2 seconds.     Findings: No rash.  Neurological:     General: No focal deficit present.     Mental Status: She is alert and oriented to person, place, and time. Mental status is at baseline.     Cranial Nerves: No  dysarthria or facial asymmetry.  Psychiatric:        Mood and Affect: Mood normal.        Speech: Speech normal.        Behavior: Behavior normal.        Thought Content: Thought content normal.        Judgment: Judgment normal.      UC Treatments / Results  Labs (all labs ordered are listed, but only abnormal results are displayed) Labs Reviewed - No data to display  EKG   Radiology No results found.  Procedures Procedures (including critical care time)  Medications Ordered in UC Medications - No data to display  Initial Impression / Assessment and Plan / UC Course  I have reviewed the triage vital signs and the nursing notes.  Pertinent labs & imaging results that were available during my care of the patient were reviewed by me and considered in my medical decision making (see chart for details).   1.  Infective otitis externa Upon initial assessment, right-sided cerumen impaction visualized.  Ear lavage performed by nursing staff in clinic reveals thick purulent drainage to the ear canal consistent with otitis externa.  Tympanic membrane visualized and intact.  Patient reports improvement in symptoms although continues to experience muffled voice sounds to the right ear, but this will likely improve with treatment of otitis externa. Ofloxacin ear drops prescribed to be used 10 drops once daily for 7 days to the right ear. Patient agreeable with plan.  Discussed physical exam and available lab work findings in clinic with patient.  Counseled patient regarding appropriate use of medications and potential side effects for all medications recommended or prescribed today. Discussed red flag signs and symptoms of worsening condition,when to call the PCP office, return to urgent care, and when to seek higher level of care in the emergency department. Patient verbalizes understanding and agreement with plan. All questions answered. Patient discharged in stable condition.    Final  Clinical Impressions(s) / UC Diagnoses   Final diagnoses:  Other infective acute otitis externa of right ear  Impacted cerumen of right ear     Discharge Instructions      We flushed out your ears today revealing an infection to the right ear canal called otitis externa. Use ofloxacin ear drops as prescribed (10 drops to the right ear once daily) for the next 7 days to treat infection. You may use debrox ear drops in the future over the counter as needed to remove ear wax from ears.  If you develop any new or worsening symptoms or do not improve in the next 2 to 3 days, please return.  If your symptoms are severe, please go to the emergency room.  Follow-up with your primary care provider for further evaluation and management of your symptoms as well as ongoing wellness visits.  I hope you feel better!     ED Prescriptions     Medication Sig Dispense Auth. Provider   ofloxacin (FLOXIN) 0.3 % OTIC solution Place 10 drops into the right ear daily. Use 10 drops 1 time daily to the right ear for the next 7 days. 5 mL Carlisle Beers, FNP      PDMP not reviewed this encounter.   Carlisle Beers, [place] 04/10/22 1731

## 2022-04-10 NOTE — ED Triage Notes (Signed)
Patient states she is having ear fullness and pain in the right ear. States it happened a few nights ago and cleared up but now the pain and fullness is back. States she can't hear out of ear.

## 2022-04-10 NOTE — Discharge Instructions (Signed)
We flushed out your ears today revealing an infection to the right ear canal called otitis externa. Use ofloxacin ear drops as prescribed (10 drops to the right ear once daily) for the next 7 days to treat infection. You may use debrox ear drops in the future over the counter as needed to remove ear wax from ears.  If you develop any new or worsening symptoms or do not improve in the next 2 to 3 days, please return.  If your symptoms are severe, please go to the emergency room.  Follow-up with your primary care provider for further evaluation and management of your symptoms as well as ongoing wellness visits.  I hope you feel better!

## 2022-06-08 NOTE — Progress Notes (Signed)
  Subjective:    Megan Bernard - 26 y.o. female MRN [id number]  Date of birth: 1995-11-19  HPI  Megan Bernard is a 26 year-old female to establish care.    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 She has never used smokeless tobacco. She reports that she does not currently use alcohol. She reports that she does not currently use drugs.   Family History  family history is not on file.   Medications: reviewed and updated   Objective:   Physical Exam There were no vitals taken for this visit. Physical Exam      Assessment & Plan:         Patient was given clear instructions to go to Emergency Department or return to medical center if symptoms don't improve, worsen, or new problems develop.The patient verbalized understanding.  I discussed the assessment and treatment plan with the patient. The patient was provided an opportunity to ask questions and all were answered. The patient agreed with the plan and demonstrated an understanding of the instructions.   The patient was advised to call back or seek an in-person evaluation if the symptoms worsen or if the condition fails to improve as  anticipated.    Ricky Stabs, NP 06/08/2022, 9:32 PM Primary Care at [facility]

## 2022-06-10 ENCOUNTER — Encounter: Payer: Self-pay | Admitting: Physician Assistant

## 2022-06-10 ENCOUNTER — Encounter: Payer: BC Managed Care – PPO | Admitting: Family

## 2022-06-11 ENCOUNTER — Ambulatory Visit (HOSPITAL_COMMUNITY): Payer: BC Managed Care – PPO

## 2022-06-14 DIAGNOSIS — F411 Generalized anxiety disorder: Secondary | ICD-10-CM | POA: Diagnosis not present

## 2022-06-14 DIAGNOSIS — F3181 Bipolar II disorder: Secondary | ICD-10-CM | POA: Diagnosis not present

## 2022-06-18 DIAGNOSIS — F418 Other specified anxiety disorders: Secondary | ICD-10-CM | POA: Diagnosis not present

## 2022-06-18 DIAGNOSIS — J4520 Mild intermittent asthma, uncomplicated: Secondary | ICD-10-CM | POA: Diagnosis not present

## 2022-06-18 DIAGNOSIS — U099 Post covid-19 condition, unspecified: Secondary | ICD-10-CM | POA: Diagnosis not present

## 2022-06-23 ENCOUNTER — Other Ambulatory Visit: Payer: Self-pay

## 2022-06-23 ENCOUNTER — Emergency Department (HOSPITAL_COMMUNITY)
Admission: EM | Admit: 2022-06-23 | Discharge: 2022-06-23 | Disposition: A | Discharge status: Home / Self Care | Payer: BC Managed Care – PPO | Attending: Emergency Medicine | Admitting: Emergency Medicine

## 2022-06-23 ENCOUNTER — Ambulatory Visit: Payer: BC Managed Care – PPO

## 2022-06-23 ENCOUNTER — Emergency Department (HOSPITAL_COMMUNITY): Payer: BC Managed Care – PPO

## 2022-06-23 ENCOUNTER — Encounter (HOSPITAL_COMMUNITY): Payer: Self-pay

## 2022-06-23 DIAGNOSIS — D72829 Elevated white blood cell count, unspecified: Secondary | ICD-10-CM | POA: Insufficient documentation

## 2022-06-23 DIAGNOSIS — Z1152 Encounter for screening for COVID-19: Secondary | ICD-10-CM | POA: Diagnosis not present

## 2022-06-23 DIAGNOSIS — J069 Acute upper respiratory infection, unspecified: Secondary | ICD-10-CM | POA: Diagnosis not present

## 2022-06-23 DIAGNOSIS — Z7951 Long term (current) use of inhaled steroids: Secondary | ICD-10-CM | POA: Diagnosis not present

## 2022-06-23 DIAGNOSIS — R0789 Other chest pain: Secondary | ICD-10-CM | POA: Insufficient documentation

## 2022-06-23 DIAGNOSIS — K5900 Constipation, unspecified: Secondary | ICD-10-CM | POA: Diagnosis not present

## 2022-06-23 DIAGNOSIS — R000 Tachycardia, unspecified: Secondary | ICD-10-CM | POA: Diagnosis not present

## 2022-06-23 DIAGNOSIS — R509 Fever, unspecified: Secondary | ICD-10-CM | POA: Diagnosis not present

## 2022-06-23 DIAGNOSIS — Z91010 Allergy to peanuts: Secondary | ICD-10-CM | POA: Diagnosis not present

## 2022-06-23 DIAGNOSIS — R059 Cough, unspecified: Secondary | ICD-10-CM | POA: Diagnosis not present

## 2022-06-23 DIAGNOSIS — J45909 Unspecified asthma, uncomplicated: Secondary | ICD-10-CM | POA: Insufficient documentation

## 2022-06-23 LAB — RESP PANEL BY RT-PCR (RSV, FLU A&B, COVID)  RVPGX2
Influenza A by PCR: NEGATIVE
Influenza B by PCR: NEGATIVE
Resp Syncytial Virus by PCR: NEGATIVE
SARS Coronavirus 2 by RT PCR: NEGATIVE

## 2022-06-23 LAB — BASIC METABOLIC PANEL
Anion gap: 10 (ref 5–15)
BUN: 10 mg/dL (ref 6–20)
CO2: 22 mmol/L (ref 22–32)
Calcium: 9.4 mg/dL (ref 8.9–10.3)
Chloride: 105 mmol/L (ref 98–111)
Creatinine, Ser: 0.7 mg/dL (ref 0.44–1.00)
GFR, Estimated: >60 mL/min (ref >60)
Glucose, Bld: 109 mg/dL — ABNORMAL HIGH (ref 70–99)
Potassium: 4 mmol/L (ref 3.5–5.1)
Sodium: 137 mmol/L (ref 135–145)

## 2022-06-23 LAB — CBC WITH DIFFERENTIAL/PLATELET
Abs Immature Granulocytes: 0.07 10*3/uL (ref 0.00–0.07)
Basophils Absolute: 0.1 10*3/uL (ref 0.0–0.1)
Basophils Relative: 1 %
Eosinophils Absolute: 0.1 10*3/uL (ref 0.0–0.5)
Eosinophils Relative: 1 %
HCT: 42.5 % (ref 36.0–46.0)
Hemoglobin: 14.1 g/dL (ref 12.0–15.0)
Immature Granulocytes: 1 %
Lymphocytes Relative: 26 %
Lymphs Abs: 3.3 10*3/uL (ref 0.7–4.0)
MCH: 29.9 pg (ref 26.0–34.0)
MCHC: 33.2 g/dL (ref 30.0–36.0)
MCV: 90.2 fL (ref 80.0–100.0)
Monocytes Absolute: 0.8 10*3/uL (ref 0.1–1.0)
Monocytes Relative: 7 %
Neutro Abs: 8 10*3/uL — ABNORMAL HIGH (ref 1.7–7.7)
Neutrophils Relative %: 64 %
Platelets: 424 10*3/uL — ABNORMAL HIGH (ref 150–400)
RBC: 4.71 MIL/uL (ref 3.87–5.11)
RDW: 11.7 % (ref 11.5–15.5)
WBC: 12.5 10*3/uL — ABNORMAL HIGH (ref 4.0–10.5)
nRBC: 0 % (ref 0.0–0.2)

## 2022-06-23 MED ORDER — LACTATED RINGERS IV BOLUS
1000.0000 mL | Freq: Once | INTRAVENOUS | Status: AC
Start: 2022-06-23 — End: 2022-06-23
  Administered 2022-06-23: 1000 mL via INTRAVENOUS

## 2022-06-23 NOTE — Discharge Instructions (Signed)
You were evaluated in the Emergency Department and after careful evaluation, we did not find any emergent condition requiring admission or further testing in the hospital.  As discussed, can drink plenty of fluids, take over-the-counter medicines.  You may increase your MiraLAX dose until you have a bowel movement.  Please return to the Emergency Department if you experience any worsening of your condition.  We encourage you to follow up with a primary care provider.  Thank you for allowing [place] to be a part of your care.

## 2022-06-23 NOTE — ED Triage Notes (Signed)
Pt states she has had flu like symptoms, body aches, fever, stuffy nose, sob for the past week. Pt states she "blacked out" for about 30 seconds.

## 2022-06-23 NOTE — ED Provider Notes (Signed)
[facility]-EMERGENCY DEPT Provider Note   CSN: [id number] Arrival date & time: 06/23/22  0827     History  Chief Complaint  Patient presents with   Influenza    Megan Bernard is a 26 y.o. female.  HPI 26 year old female with a history of asthma and tachycardia presents to the ER with complaints of bodyaches, fever, stuffy nose and shortness of breath.  Reports having an episode where she thought she "blacked out" for about 30 seconds.  She reports that everyone at work has the flu.  She has not tested herself.  She ports feeling dehydrated and has had poor p.o. intake.  She also reports some chest discomfort and has been coughing a lot. She is on exogenous hormones.     Home Medications Prior to Admission medications   Medication Sig Start Date End Date Taking? Authorizing Provider  albuterol (PROVENTIL) (2.5 MG/3ML) 0.083% nebulizer solution as needed. 03/11/14   [provider]  albuterol (VENTOLIN HFA) 108 (90 Base) MCG/ACT inhaler as needed. 05/31/17   [provider]  beclomethasone (QVAR REDIHALER) 40 MCG/ACT inhaler as needed. 11/03/20   [provider]  buPROPion (WELLBUTRIN XL) 150 MG 24 hr tablet daily. 06/29/17   [provider]  buPROPion (WELLBUTRIN XL) 300 MG 24 hr tablet daily. 02/06/18   [provider]  EPINEPHrine (EPIPEN JR) 0.15 MG/0.3ML injection Inject into the muscle as needed.    [provider]  escitalopram (LEXAPRO) 10 MG tablet Take 10 mg by mouth daily. Patient not taking: Reported on 09/23/2021    [provider]  hydrOXYzine (ATARAX/VISTARIL) 25 MG tablet as needed. 03/15/16   [provider]  Lactobacillus-Inulin (CULTURELLE DIGESTIVE DAILY) CAPS Take by mouth daily at 6 (six) AM.    [provider]  lamoTRIgine (LAMICTAL) 200 MG tablet Take 200 mg by mouth daily.    [provider]  LORazepam (ATIVAN) 0.5 MG tablet as needed. 07/23/19   [provider]  methylphenidate (RITALIN) 5 MG tablet Take 7.5-10 mg by mouth as needed.    [provider]  norgestimate-ethinyl estradiol (ORTHO-CYCLEN) 0.25-35 MG-MCG tablet Take 1 tablet by mouth daily. 05/28/16   [provider]  ofloxacin (FLOXIN) 0.3 % OTIC solution Place 10 drops into the right ear daily. Use 10 drops 1 time daily to the right ear for the next 7 days. 04/10/22   Carlisle Beers, FNP  predniSONE (DELTASONE) 20 MG tablet Take 2 tablets (40 mg total) by mouth daily with breakfast. 03/19/22   [name], PA-C  triamcinolone ointment (KENALOG) 0.1 % Apply topically as needed. Patient not taking: Reported on 09/23/2021 01/16/14   [provider]      Allergies    Peanut-containing drug products, Amoxicillin, Nickel, Other, and Justicia adhatoda (malabar nut tree) [justicia adhatoda]    Review of Systems   Review of Systems Ten systems reviewed and are negative for acute change, except as noted in the HPI.   Physical Exam Updated Vital Signs BP 127/79   Pulse 97   Temp 98.5 F (36.9 C) (Oral)   Resp 17   Ht 5' (1.524 m)   Wt 71.7 kg   LMP 06/09/2022 (Approximate)   SpO2 100%   BMI 30.86 kg/m  Physical Exam Vitals and nursing note reviewed.  Constitutional:      General: She is not in acute distress.    Appearance: She is well-developed.  HENT:     Head: Normocephalic and atraumatic.  Eyes:     Conjunctiva/sclera: Conjunctivae normal.  Cardiovascular:     Rate and Rhythm: Normal rate and regular rhythm.     Heart sounds: No murmur heard. Pulmonary:     Effort: Pulmonary effort is normal. No respiratory distress.     Breath sounds: Normal breath sounds.  Chest:     Comments: Reproducible mild chest wall tenderness Abdominal:     Palpations: Abdomen is soft.     Tenderness: There is no abdominal tenderness.  Musculoskeletal:        General: No swelling.     Cervical back: Neck supple.  Skin:    General: Skin is  warm and dry.     Capillary Refill: Capillary refill takes less than 2 seconds.  Neurological:     General: No focal deficit present.     Mental Status: She is alert and oriented to person, place, and time.  Psychiatric:        Mood and Affect: Mood normal.     ED Results / Procedures / Treatments   Labs (all labs ordered are listed, but only abnormal results are displayed) Labs Reviewed  CBC WITH DIFFERENTIAL/PLATELET - Abnormal; Notable for the following components:      Result Value   WBC 12.5 (*)    Platelets 424 (*)    Neutro Abs 8.0 (*)    All other components within normal limits  BASIC METABOLIC PANEL - Abnormal; Notable for the following components:   Glucose, Bld 109 (*)    All other components within normal limits  RESP PANEL BY RT-PCR (RSV, FLU A&B, COVID)  [id number]    EKG EKG Interpretation  Date/Time:  Wednesday June 23 2022 09:54:35 EST Ventricular Rate:  113 PR Interval:  171 QRS Duration: 106 QT Interval:  333 QTC Calculation: 457 R Axis:   70 Text Interpretation: Sinus or ectopic atrial tachycardia Confirmed by Virgina Norfolk ([id number]) on 06/23/2022 10:48:49 AM  Radiology DG Chest 2 View  Result Date: 06/23/2022 CLINICAL DATA:  Flu like symptoms with cough. EXAM: CHEST - 2 VIEW COMPARISON:  10/03/2021 FINDINGS: Normal heart size and mediastinal contours. No acute infiltrate or edema. No effusion or pneumothorax. No acute osseous findings. IMPRESSION: Negative for pneumonia. Electronically Signed   By: Tiburcio Pea M.D.   On: 06/23/2022 09:49    Procedures Procedures    Medications Ordered in ED Medications  lactated ringers bolus 1,000 mL (1,000 mLs Intravenous New Bag/Given 06/23/22 1032)    ED Course/ Medical Decision Making/ A&P                           Medical Decision Making Amount and/or Complexity of Data Reviewed Labs: ordered. Radiology: ordered.   26 year old female presenting with concerns of fatigue, episode of "blacking  out" which last about 30 seconds, poor p.o. intake.  On arrival she was tachycardic to the 120s and hypertensive however on repeat after IV fluids blood pressure and heart rate improved.  Lung sounds are clear, no evidence of hypoxia.  Lab work ordered, reviewed, mild leukocytosis of 12.5, BMP without any significant Electra abnormalities.  Normal renal function.  COVID and flu and RSV are negative.  Chest x-ray ordered, reviewed, agree with radiology read, negative for pneumonia or acute findings.  EKG sinus tachycardia on arrival.  Overall, I suspect symptoms are from a viral illness.  PE considered however tachycardia did improve with 1 L IV fluids and no evidence of hypoxia.  I did review her chart, she has had episodes of tachycardia in the past and has had a CT PE study which has been negative.  Chest pain is reproducible and I believe is musculoskeletal from coughing.  She denies any pleuritic symptoms. She is on exogenous hormones but again suspicion for PE is low at this time.  Pt reports not having a BM in 5 days. Abdomen is soft nontender.  Low suspicion for small bowel obstruction or acute abdomen.  Recommended increasing MiraLAX dose.  Considered admission however given reassuring vital signs, and overall workup patient is appropriate for discharge with supportive care and strict return precautions.  She is agreeable to this.  Stable for discharge.  Discussed with Dr. Lockie Mola who is agreeable to the above plan and disposition Final Clinical Impression(s) / ED Diagnoses Final diagnoses:  Upper respiratory tract infection, unspecified type  Constipation, unspecified constipation type    Rx / DC Orders ED Discharge Orders     None         Mare Ferrari, PA-C 06/23/22 1131    Virgina Norfolk, DO 06/23/22 1327

## 2022-06-26 ENCOUNTER — Other Ambulatory Visit: Payer: Self-pay

## 2022-06-26 ENCOUNTER — Ambulatory Visit
Admission: RE | Admit: 2022-06-26 | Discharge: 2022-06-26 | Disposition: A | Discharge status: Home / Self Care | Payer: BC Managed Care – PPO | Source: Ambulatory Visit

## 2022-06-26 VITALS — BP 139/90 | HR 107 | Temp 98.5°F | Resp 20 | Ht 60.0 in | Wt 158.0 lb

## 2022-06-26 DIAGNOSIS — R21 Rash and other nonspecific skin eruption: Secondary | ICD-10-CM | POA: Diagnosis not present

## 2022-06-26 DIAGNOSIS — R053 Chronic cough: Secondary | ICD-10-CM | POA: Diagnosis not present

## 2022-06-26 DIAGNOSIS — J4531 Mild persistent asthma with (acute) exacerbation: Secondary | ICD-10-CM

## 2022-06-26 HISTORY — DX: Bipolar disorder, unspecified: F31.9

## 2022-06-26 MED ORDER — PREDNISONE 10 MG (21) PO TBPK: ORAL_TABLET | Freq: Every day | ORAL | 0 refills | Status: AC

## 2022-06-26 MED ORDER — FEXOFENADINE HCL 180 MG PO TABS: 180.0000 mg | ORAL_TABLET | Freq: Every day | ORAL | 0 refills | Status: AC

## 2022-06-26 MED ORDER — BENZONATATE 200 MG PO CAPS: 200.0000 mg | ORAL_CAPSULE | Freq: Three times a day (TID) | ORAL | 0 refills | Status: AC | PRN

## 2022-06-26 MED ORDER — PROMETHAZINE-DM 6.25-15 MG/5ML PO SYRP: 5.0000 mL | ORAL_SOLUTION | Freq: Two times a day (BID) | ORAL | 0 refills | Status: AC | PRN

## 2022-06-26 NOTE — Discharge Instructions (Addendum)
Advised patient to take medications as directed with food to completion.  Advised may take Tessalon Perles daily or as needed for cough.  Advised may take Promethazine DM at night for cough due to sedate of effects.  Advised patient not to take cough medications together.  Advised may take OTC Tylenol 1000 mg every 6 hours for fever.  Encouraged patient to increase daily water intake to 64 ounces per day while taking these medications.  Advised patient symptoms worsen and/or unresolved please follow-up with PCP or here for further evaluation.

## 2022-06-26 NOTE — ED Provider Notes (Signed)
[facility]    CSN: [id number] Arrival date & time: 06/26/22  1405      History   Chief Complaint Chief Complaint  Patient presents with   Rash   Fever    HPI Megan Bernard is a 26 y.o. female.   HPI 26 year old female presents with rash and fever since 06/23/2022.  Patient was evaluated in emergency department on 06/23/2022 please see that epic encounter note.  PMH significant for tachycardia, shortness of breath, and chronic cough.  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      Home Medications    Prior to Admission medications   Medication Sig Start Date End Date Taking? Authorizing Provider  benzonatate (TESSALON) 200 MG capsule Take 1 capsule (200 mg total) by mouth 3 (three) times daily as needed for up to 7 days. 06/26/22 07/03/22 Yes Trevor Iha, FNP  fexofenadine [facility] ALLERGY) 180 MG tablet Take 1 tablet (180 mg total) by mouth daily for 15 days. 06/26/22 07/11/22 Yes Trevor Iha, FNP  predniSONE (STERAPRED UNI-PAK 21 TAB) 10 MG (21) TBPK tablet Take by mouth daily. Take 6 tabs by mouth daily  for 2 days, then 5 tabs for 2 days, then 4 tabs for 2 days, then 3 tabs for 2 days, 2 tabs for 2 days, then 1 tab by mouth daily for 2 days 06/26/22  Yes Trevor Iha, FNP  promethazine-dextromethorphan (PROMETHAZINE-DM) 6.25-15 MG/5ML syrup Take 5 mLs by mouth 2 (two) times daily as needed for cough. 06/26/22  Yes Trevor Iha, FNP  Pseudoeph-Doxylamine-DM-APAP (DAYQUIL/NYQUIL COLD/FLU  RELIEF PO) Take by mouth.   Yes [provider]  albuterol (PROVENTIL) (2.5 MG/3ML) 0.083% nebulizer solution as needed. 03/11/14   [provider]  albuterol (VENTOLIN HFA) 108 (90 Base) MCG/ACT inhaler as needed. 05/31/17   [provider]  beclomethasone (QVAR REDIHALER) 40 MCG/ACT inhaler as needed. 11/03/20   [provider]  buPROPion (WELLBUTRIN XL) 150 MG 24 hr tablet daily. 06/29/17   [provider]  buPROPion (WELLBUTRIN XL) 300 MG 24 hr tablet daily. 02/06/18   [provider]  EPINEPHrine (EPIPEN JR) 0.15 MG/0.3ML injection Inject into the muscle as needed.    [provider]  hydrOXYzine (ATARAX/VISTARIL) 25 MG tablet as needed. 03/15/16   [provider]  Lactobacillus-Inulin (CULTURELLE DIGESTIVE DAILY) CAPS Take by mouth daily at 6 (six) AM.    [provider]  lamoTRIgine (LAMICTAL) 200 MG tablet Take 200 mg by mouth daily.    [provider]  LORazepam (ATIVAN) 0.5 MG tablet as needed. 07/23/19   [provider]  methylphenidate (RITALIN) 5 MG tablet Take 7.5-10 mg by mouth as needed.    [provider]  norgestimate-ethinyl estradiol (ORTHO-CYCLEN) 0.25-35 MG-MCG tablet Take 1 tablet by mouth daily. 05/28/16   [provider]  ofloxacin (FLOXIN) 0.3 % OTIC solution Place 10 drops into the right ear daily. Use 10 drops 1 time daily to the right ear for the  next 7 days. 04/10/22   Carlisle Beers, FNP    Family History Family History  Problem Relation Age of Onset   Hypertension Mother    Diabetes Mother    Heart disease Mother    Fibromyalgia Mother    Healthy Father     Social History Social History   Tobacco Use   Smoking status: Never   Smokeless tobacco: Never  Vaping Use   Vaping Use: Never used  Substance Use Topics   Alcohol use: Not Currently   Drug use: Not Currently     Allergies   Peanut-containing drug products, Amoxicillin,  Nickel, Other, and Justicia adhatoda (malabar nut tree) [justicia adhatoda]   Review of Systems Review of Systems  Respiratory:  Positive for cough.   Skin:  Positive for rash.  All other systems reviewed and are negative.    Physical Exam Triage Vital Signs ED Triage Vitals  Enc Vitals Group     BP 06/26/22 1515 (!) 139/90     Pulse Rate 06/26/22 1515 (!) 107     Resp 06/26/22 1515 20     Temp 06/26/22 1515 98.5 F (36.9 C)     Temp Source 06/26/22 1515 Oral     SpO2 06/26/22 1515 98 %     Weight 06/26/22 1508 158 lb (71.7 kg)     Height 06/26/22 1508 5' (1.524 m)     Head Circumference --      Peak Flow --      Pain Score 06/26/22 1508 6     Pain Loc --      Pain Edu? --      Excl. in GC? --    No data found.  Updated Vital Signs BP (!) 139/90 (BP Location: Left Arm)   Pulse (!) 107   Temp 98.5 F (36.9 C) (Oral)   Resp 20   Ht 5' (1.524 m)   Wt 158 lb (71.7 kg)   LMP 06/09/2022 (Approximate)   SpO2 98%   BMI 30.86 kg/m    Physical Exam Vitals and nursing note reviewed.  Constitutional:      Appearance: Normal appearance. She is obese. She is ill-appearing.  HENT:     Head: Normocephalic and atraumatic.     Right Ear: Tympanic membrane, ear canal and external ear normal.     Left Ear: Tympanic membrane, ear canal and external ear normal.     Mouth/Throat:     Mouth: Mucous membranes are moist.     Pharynx: Oropharynx is clear.  Eyes:     Extraocular Movements: Extraocular movements intact.     Conjunctiva/sclera: Conjunctivae normal.     Pupils: Pupils are equal, round, and reactive to light.  Cardiovascular:     Rate and Rhythm: Normal rate and regular rhythm.     Pulses: Normal pulses.     Heart sounds: Normal heart sounds.  Pulmonary:     Effort: Pulmonary effort is normal.     Breath sounds: Normal breath sounds. No wheezing, rhonchi or rales.     Comments: Frequent non-productive cough noted on exam Musculoskeletal:        General: Normal  range of motion.     Cervical back: Normal range of motion and neck supple. No tenderness.  Lymphadenopathy:     Cervical: No cervical adenopathy.  Skin:    General: Skin is warm and dry.     Comments: Right cheek/left upper chest breast area: Mild erythematous macular papular eruption, pruritic in nature  per patient-please see images below  Neurological:     General: No focal deficit present.     Mental Status: She is alert and oriented to person, place, and time.         UC Treatments / Results  Labs (all labs ordered are listed, but only abnormal results are displayed) Labs Reviewed - No data to display  EKG   Radiology No results found.  Procedures Procedures (including critical care time)  Medications Ordered in UC Medications - No data to display  Initial Impression / Assessment and Plan / UC Course  I have reviewed the triage vital signs and the nursing notes.  Pertinent labs & imaging results that were available during my care of the patient were reviewed by me and considered in my medical decision making (see chart for details).     MDM: 1. Chronic cough-Rx'd Tessalon, Promethazine DM; 2.  Mild persistent asthma with acute exacerbation-Rx'd Sterapred Unipak; 3.  Rash and nonspecific skin eruption-Rx'd Sterapred Unipak. Advised patient to take medications as directed with food to completion.  Advised may take Tessalon Perles daily or as needed for cough.  Advised may take Promethazine DM at night for cough due to sedate of effects.  Advised patient not to take cough medications together.  Advised may take OTC Tylenol 1000 mg every 6 hours for fever.  Encouraged patient to increase daily water intake to 64 ounces per day while taking these medications.  Patient discharged home, hemodynamically stable. Final Clinical Impressions(s) / UC Diagnoses   Final diagnoses:  Rash and nonspecific skin eruption  Chronic cough  Mild persistent asthma with acute exacerbation      Discharge Instructions      Advised patient to take medications as directed with food to completion.  Advised may take Tessalon Perles daily or as needed for cough.  Advised may take Promethazine DM at night for cough due to sedate of effects.  Advised patient not to take cough medications together.  Advised may take OTC Tylenol 1000 mg every 6 hours for fever.  Encouraged patient to increase daily water intake to 64 ounces per day while taking these medications.  Advised patient symptoms worsen and/or unresolved please follow-up with PCP or here for further evaluation.     ED Prescriptions     Medication Sig Dispense Auth. Provider   predniSONE (STERAPRED UNI-PAK 21 TAB) 10 MG (21) TBPK tablet Take by mouth daily. Take 6 tabs by mouth daily  for 2 days, then 5 tabs for 2 days, then 4 tabs for 2 days, then 3 tabs for 2 days, 2 tabs for 2 days, then 1 tab by mouth daily for 2 days 42 tablet Trevor Iha, FNP   benzonatate (TESSALON) 200 MG capsule Take 1 capsule (200 mg total) by mouth 3 (three) times daily as needed for up to 7 days. 40 capsule Trevor Iha, FNP   promethazine-dextromethorphan (PROMETHAZINE-DM) 6.25-15 MG/5ML syrup Take 5 mLs by mouth 2 (two) times daily as needed for cough. 118 mL Trevor Iha, FNP   fexofenadine [facility] ALLERGY) 180 MG tablet Take 1 tablet (180 mg total) by mouth daily for 15 days. 15 tablet Trevor Iha, FNP      PDMP not reviewed this encounter.   Trevor Iha, FNP 06/26/22 1702

## 2022-06-26 NOTE — ED Triage Notes (Signed)
Pt presents to Urgent Care with c/o respiratory illness since 06/18/22--negative for COVID, flu, and RSV on 06/23/22. Now c/o rash to chest x 2 days and "asthma problems" w/ continued fever.

## 2022-07-03 ENCOUNTER — Encounter (HOSPITAL_COMMUNITY): Payer: Self-pay

## 2022-07-03 ENCOUNTER — Emergency Department (HOSPITAL_COMMUNITY)
Admission: EM | Admit: 2022-07-03 | Discharge: 2022-07-04 | Discharge status: Against Medical Advice | Payer: BC Managed Care – PPO | Attending: Student | Admitting: Student

## 2022-07-03 ENCOUNTER — Other Ambulatory Visit: Payer: Self-pay

## 2022-07-03 DIAGNOSIS — R350 Frequency of micturition: Secondary | ICD-10-CM | POA: Diagnosis not present

## 2022-07-03 DIAGNOSIS — R319 Hematuria, unspecified: Secondary | ICD-10-CM | POA: Diagnosis not present

## 2022-07-03 DIAGNOSIS — Z5321 Procedure and treatment not carried out due to patient leaving prior to being seen by health care provider: Secondary | ICD-10-CM | POA: Diagnosis not present

## 2022-07-03 LAB — URINALYSIS, ROUTINE W REFLEX MICROSCOPIC
Bacteria, UA: NONE SEEN
Bilirubin Urine: NEGATIVE
Glucose, UA: NEGATIVE mg/dL
Ketones, ur: NEGATIVE mg/dL
Leukocytes,Ua: NEGATIVE
Nitrite: NEGATIVE
Protein, ur: NEGATIVE mg/dL
Specific Gravity, Urine: 1.009 (ref 1.005–1.030)
pH: 6 (ref 5.0–8.0)

## 2022-07-03 LAB — PREGNANCY, URINE: Preg Test, Ur: NEGATIVE

## 2022-07-03 NOTE — ED Provider Triage Note (Signed)
Emergency Medicine Provider Triage Evaluation Note  Kalene Cutler , a 26 y.o. female  was evaluated in triage.  Pt complains of hematuria for 2 days.  Denies any dysuria.  She thought that her menstrual period came early yesterday and now she is noted it is her urine.  No medical history.  Review of Systems  Positive:  Negative:   Physical Exam  BP 133/82 (BP Location: Left Arm)   Pulse (!) 119   Temp 98.4 F (36.9 C) (Oral)   Resp 18   LMP 06/09/2022 (Approximate)   SpO2 100%  Gen:   Awake, no distress   Resp:  Normal effort  MSK:   Moves extremities without difficulty  Other:  No CVA tenderness, mild suprapubic tenderness  Medical Decision Making  Medically screening exam initiated at 4:24 PM.  Appropriate orders placed.  Prajna Vanderpool was informed that the remainder of the evaluation will be completed by another provider, this initial triage assessment does not replace that evaluation, and the importance of remaining in the ED until their evaluation is complete.     Saddie Benders, PA-C 07/03/22 1624

## 2022-07-03 NOTE — ED Triage Notes (Addendum)
Patient said she began to have blood in her urine since yesterday. No nausea. No burning with urination but urgency and frequency. No blood thinners.

## 2022-07-04 ENCOUNTER — Ambulatory Visit (HOSPITAL_COMMUNITY): Payer: Self-pay

## 2022-07-04 NOTE — ED Notes (Signed)
Called 3x for room placement. Eloped from waiting area.  

## 2022-07-05 LAB — URINE CULTURE: Culture: 10000 — AB

## 2022-07-19 DIAGNOSIS — F411 Generalized anxiety disorder: Secondary | ICD-10-CM | POA: Diagnosis not present

## 2022-07-19 DIAGNOSIS — F3181 Bipolar II disorder: Secondary | ICD-10-CM | POA: Diagnosis not present

## 2022-08-19 DIAGNOSIS — F411 Generalized anxiety disorder: Secondary | ICD-10-CM | POA: Diagnosis not present

## 2022-08-19 DIAGNOSIS — F3181 Bipolar II disorder: Secondary | ICD-10-CM | POA: Diagnosis not present

## 2022-09-27 DIAGNOSIS — F901 Attention-deficit hyperactivity disorder, predominantly hyperactive type: Secondary | ICD-10-CM | POA: Diagnosis not present

## 2022-09-27 DIAGNOSIS — F3181 Bipolar II disorder: Secondary | ICD-10-CM | POA: Diagnosis not present

## 2022-09-27 DIAGNOSIS — F411 Generalized anxiety disorder: Secondary | ICD-10-CM | POA: Diagnosis not present

## 2022-11-12 ENCOUNTER — Emergency Department (HOSPITAL_COMMUNITY)
Admission: EM | Admit: 2022-11-12 | Discharge: 2022-11-12 | Discharge status: Against Medical Advice | Payer: BC Managed Care – PPO | Attending: Emergency Medicine | Admitting: Emergency Medicine

## 2022-11-12 ENCOUNTER — Encounter (HOSPITAL_COMMUNITY): Payer: Self-pay | Admitting: Emergency Medicine

## 2022-11-12 ENCOUNTER — Other Ambulatory Visit: Payer: Self-pay

## 2022-11-12 DIAGNOSIS — R110 Nausea: Secondary | ICD-10-CM | POA: Insufficient documentation

## 2022-11-12 DIAGNOSIS — L2089 Other atopic dermatitis: Secondary | ICD-10-CM | POA: Diagnosis not present

## 2022-11-12 DIAGNOSIS — Z5321 Procedure and treatment not carried out due to patient leaving prior to being seen by health care provider: Secondary | ICD-10-CM | POA: Diagnosis not present

## 2022-11-12 DIAGNOSIS — R1011 Right upper quadrant pain: Secondary | ICD-10-CM | POA: Insufficient documentation

## 2022-11-12 DIAGNOSIS — L503 Dermatographic urticaria: Secondary | ICD-10-CM | POA: Diagnosis not present

## 2022-11-12 LAB — URINALYSIS, ROUTINE W REFLEX MICROSCOPIC
Bilirubin Urine: NEGATIVE
Glucose, UA: NEGATIVE mg/dL
Hgb urine dipstick: NEGATIVE
Ketones, ur: NEGATIVE mg/dL
Leukocytes,Ua: NEGATIVE
Nitrite: NEGATIVE
Protein, ur: NEGATIVE mg/dL
Specific Gravity, Urine: 1.016 (ref 1.005–1.030)
pH: 6 (ref 5.0–8.0)

## 2022-11-12 LAB — CBC
HCT: 43.7 % (ref 36.0–46.0)
Hemoglobin: 14.9 g/dL (ref 12.0–15.0)
MCH: 30.3 pg (ref 26.0–34.0)
MCHC: 34.1 g/dL (ref 30.0–36.0)
MCV: 88.8 fL (ref 80.0–100.0)
Platelets: 439 K/uL — ABNORMAL HIGH (ref 150–400)
RBC: 4.92 MIL/uL (ref 3.87–5.11)
RDW: 11.5 % (ref 11.5–15.5)
WBC: 13.2 K/uL — ABNORMAL HIGH (ref 4.0–10.5)
nRBC: 0 % (ref 0.0–0.2)

## 2022-11-12 LAB — I-STAT BETA HCG BLOOD, ED (MC, WL, AP ONLY): I-stat hCG, quantitative: <5 m[IU]/mL (ref <5)

## 2022-11-12 LAB — COMPREHENSIVE METABOLIC PANEL
ALT: 26 U/L (ref 0–44)
AST: 20 U/L (ref 15–41)
Albumin: 4.7 g/dL (ref 3.5–5.0)
Alkaline Phosphatase: 60 U/L (ref 38–126)
Anion gap: 10 (ref 5–15)
BUN: 15 mg/dL (ref 6–20)
CO2: 24 mmol/L (ref 22–32)
Calcium: 10.2 mg/dL (ref 8.9–10.3)
Chloride: 103 mmol/L (ref 98–111)
Creatinine, Ser: 0.84 mg/dL (ref 0.44–1.00)
GFR, Estimated: >60 mL/min (ref >60)
Glucose, Bld: 96 mg/dL (ref 70–99)
Potassium: 3.7 mmol/L (ref 3.5–5.1)
Sodium: 137 mmol/L (ref 135–145)
Total Bilirubin: 0.4 mg/dL (ref 0.3–1.2)
Total Protein: 8.5 g/dL — ABNORMAL HIGH (ref 6.5–8.1)

## 2022-11-12 LAB — LIPASE, BLOOD: Lipase: 46 U/L (ref 11–51)

## 2022-11-12 NOTE — ED Notes (Signed)
Pt called out for the second time out of the lobby to be brought back to a room. Pt did not answer.

## 2022-11-12 NOTE — ED Notes (Signed)
RN went to lobby for the third time to call pt back for her room. Pt not present in lobby

## 2022-11-12 NOTE — ED Notes (Addendum)
RN went out to lobby to call pt to come back to room and pt was not present.

## 2022-11-12 NOTE — ED Triage Notes (Signed)
Pt in with sharp RUQ pain that began 2 days ago. States the pain radiates to her back. Worsened nausea past few days.

## 2022-11-13 ENCOUNTER — Emergency Department (HOSPITAL_BASED_OUTPATIENT_CLINIC_OR_DEPARTMENT_OTHER): Payer: BC Managed Care – PPO

## 2022-11-13 ENCOUNTER — Emergency Department (HOSPITAL_BASED_OUTPATIENT_CLINIC_OR_DEPARTMENT_OTHER)
Admission: EM | Admit: 2022-11-13 | Discharge: 2022-11-13 | Disposition: A | Discharge status: Home / Self Care | Payer: BC Managed Care – PPO | Attending: Emergency Medicine | Admitting: Emergency Medicine

## 2022-11-13 ENCOUNTER — Encounter (HOSPITAL_BASED_OUTPATIENT_CLINIC_OR_DEPARTMENT_OTHER): Payer: Self-pay | Admitting: Emergency Medicine

## 2022-11-13 DIAGNOSIS — R1011 Right upper quadrant pain: Secondary | ICD-10-CM | POA: Diagnosis not present

## 2022-11-13 DIAGNOSIS — J45909 Unspecified asthma, uncomplicated: Secondary | ICD-10-CM | POA: Insufficient documentation

## 2022-11-13 DIAGNOSIS — Z91010 Allergy to peanuts: Secondary | ICD-10-CM | POA: Diagnosis not present

## 2022-11-13 DIAGNOSIS — R1031 Right lower quadrant pain: Secondary | ICD-10-CM

## 2022-11-13 DIAGNOSIS — R110 Nausea: Secondary | ICD-10-CM | POA: Diagnosis not present

## 2022-11-13 DIAGNOSIS — R109 Unspecified abdominal pain: Secondary | ICD-10-CM | POA: Diagnosis not present

## 2022-11-13 LAB — CBC WITH DIFFERENTIAL/PLATELET
Abs Immature Granulocytes: 0.06 10*3/uL (ref 0.00–0.07)
Basophils Absolute: 0.1 10*3/uL (ref 0.0–0.1)
Basophils Relative: 1 %
Eosinophils Absolute: 0.1 10*3/uL (ref 0.0–0.5)
Eosinophils Relative: 1 %
HCT: 38.1 % (ref 36.0–46.0)
Hemoglobin: 13.3 g/dL (ref 12.0–15.0)
Immature Granulocytes: 1 %
Lymphocytes Relative: 31 %
Lymphs Abs: 3.6 10*3/uL (ref 0.7–4.0)
MCH: 30.6 pg (ref 26.0–34.0)
MCHC: 34.9 g/dL (ref 30.0–36.0)
MCV: 87.6 fL (ref 80.0–100.0)
Monocytes Absolute: 0.7 10*3/uL (ref 0.1–1.0)
Monocytes Relative: 6 %
Neutro Abs: 6.9 10*3/uL (ref 1.7–7.7)
Neutrophils Relative %: 60 %
Platelets: 362 10*3/uL (ref 150–400)
RBC: 4.35 MIL/uL (ref 3.87–5.11)
RDW: 11.3 % — ABNORMAL LOW (ref 11.5–15.5)
WBC: 11.5 10*3/uL — ABNORMAL HIGH (ref 4.0–10.5)
nRBC: 0 % (ref 0.0–0.2)

## 2022-11-13 LAB — LIPASE, BLOOD: Lipase: 32 U/L (ref 11–51)

## 2022-11-13 LAB — WET PREP, GENITAL
Clue Cells Wet Prep HPF POC: NONE SEEN
Sperm: NONE SEEN
Trich, Wet Prep: NONE SEEN
WBC, Wet Prep HPF POC: <10 (ref <10)
Yeast Wet Prep HPF POC: NONE SEEN

## 2022-11-13 LAB — COMPREHENSIVE METABOLIC PANEL
ALT: 22 U/L (ref 0–44)
AST: 20 U/L (ref 15–41)
Albumin: 4.1 g/dL (ref 3.5–5.0)
Alkaline Phosphatase: 52 U/L (ref 38–126)
Anion gap: 9 (ref 5–15)
BUN: 14 mg/dL (ref 6–20)
CO2: 25 mmol/L (ref 22–32)
Calcium: 9.1 mg/dL (ref 8.9–10.3)
Chloride: 102 mmol/L (ref 98–111)
Creatinine, Ser: 0.83 mg/dL (ref 0.44–1.00)
GFR, Estimated: >60 mL/min (ref >60)
Glucose, Bld: 87 mg/dL (ref 70–99)
Potassium: 3.6 mmol/L (ref 3.5–5.1)
Sodium: 136 mmol/L (ref 135–145)
Total Bilirubin: 0.7 mg/dL (ref 0.3–1.2)
Total Protein: 7.4 g/dL (ref 6.5–8.1)

## 2022-11-13 MED ORDER — MORPHINE SULFATE (PF) 4 MG/ML IV SOLN
4.0000 mg | Freq: Once | INTRAVENOUS | Status: AC
  Administered 2022-11-13: 4 mg via INTRAVENOUS
  Filled 2022-11-13: qty 1

## 2022-11-13 MED ORDER — ONDANSETRON HCL 4 MG/2ML IJ SOLN
4.0000 mg | Freq: Once | INTRAMUSCULAR | Status: AC
  Administered 2022-11-13: 4 mg via INTRAVENOUS
  Filled 2022-11-13: qty 2

## 2022-11-13 MED ORDER — IOHEXOL 300 MG/ML  SOLN
80.0000 mL | Freq: Once | INTRAMUSCULAR | Status: AC | PRN
  Administered 2022-11-13: 80 mL via INTRAVENOUS

## 2022-11-13 MED ORDER — KETOROLAC TROMETHAMINE 15 MG/ML IJ SOLN
15.0000 mg | Freq: Once | INTRAMUSCULAR | Status: AC
  Administered 2022-11-13: 15 mg via INTRAVENOUS
  Filled 2022-11-13: qty 1

## 2022-11-13 NOTE — Discharge Instructions (Signed)
You have been seen today for your complaint of right lower quadrant abdominal pain. Your lab work was overall very reassuring. Your imaging was reassuring and showed no abnormalities. Your discharge medications include Alternate tylenol and ibuprofen for pain. You may alternate these every 4 hours. You may take up to 800 mg of ibuprofen at a time and up to 1000 mg of tylenol. Follow up with: Your primary care provider in 3 days if your symptoms do not improve Please seek immediate medical care if you develop any of the following symptoms: Your pain does not go away as soon as your health care provider told you to expect. You cannot stop vomiting. Your pain is only in areas of the abdomen, such as the right side or the left lower portion of the abdomen. Pain on the right side could be caused by appendicitis. You have bloody or black stools, or stools that look like tar. You have severe pain, cramping, or bloating in your abdomen. You have signs of dehydration, such as: Dark urine, very little urine, or no urine. Cracked lips. Dry mouth. Sunken eyes. Sleepiness. Weakness. You have trouble breathing or chest pain. At this time there does not appear to be the presence of an emergent medical condition, however there is always the potential for conditions to change. Please read and follow the below instructions.  Do not take your medicine if  develop an itchy rash, swelling in your mouth or lips, or difficulty breathing; call 911 and seek immediate emergency medical attention if this occurs.  You may review your lab tests and imaging results in their entirety on your MyChart account.  Please discuss all results of fully with your primary care provider and other specialist at your follow-up visit.  Note: Portions of this text may have been transcribed using voice recognition software. Every effort was made to ensure accuracy; however, inadvertent computerized transcription errors may still be  present.

## 2022-11-13 NOTE — ED Provider Notes (Signed)
[facility] Provider Note   CSN: [id number] Arrival date & time: 11/13/22  1433     History  Chief Complaint  Patient presents with   Abdominal Pain    Megan Bernard is a 27 y.o. female.  With history of asthma and bipolar 1 disorder who presents to the ED for evaluation of right lower quadrant abdominal pain.  It began as right upper quadrant abdominal pain approximately 3 days ago but is now localized to the right lower quadrant with radiation to the right flank.  It is a constant ache with intermittent sharp and shooting pains.  It is aggravated by any movement.  There are no alleviating factors.  Her last bowel movement was 2 days ago, but patient states she has been unable to eat or drink for the past 3 days.  She is still passing gas.  She denies history of abdominal surgery.  She denies pelvic pain, dysuria, frequency, urgency, hematuria, vaginal pain, discharge, odor.  Also denies fevers, chills, chest pain, shortness of breath, vomiting, diarrhea, melena, hematochezia.  Last oral intake was midnight.   Abdominal Pain Associated symptoms: nausea        Home Medications Prior to Admission medications   Medication Sig Start Date End Date Taking? Authorizing Provider  albuterol (PROVENTIL) (2.5 MG/3ML) 0.083% nebulizer solution as needed. 03/11/14   [provider]  albuterol (VENTOLIN HFA) 108 (90 Base) MCG/ACT inhaler as needed. 05/31/17   [provider]  beclomethasone (QVAR REDIHALER) 40 MCG/ACT inhaler as needed. 11/03/20   [provider]  buPROPion (WELLBUTRIN XL) 150 MG 24 hr tablet daily. 06/29/17   [provider]  buPROPion (WELLBUTRIN XL) 300 MG 24 hr tablet daily. 02/06/18   [provider]  EPINEPHrine (EPIPEN JR) 0.15 MG/0.3ML injection Inject into the muscle as needed.    [provider]  fexofenadine (ALLEGRA ALLERGY) 180 MG tablet Take 1 tablet (180 mg total) by  mouth daily for 15 days. 06/26/22 07/11/22  Trevor Iha, FNP  hydrOXYzine (ATARAX/VISTARIL) 25 MG tablet as needed. 03/15/16   [provider]  Lactobacillus-Inulin (CULTURELLE DIGESTIVE DAILY) CAPS Take by mouth daily at 6 (six) AM.    [provider]  lamoTRIgine (LAMICTAL) 200 MG tablet Take 200 mg by mouth daily.    [provider]  LORazepam (ATIVAN) 0.5 MG tablet as needed. 07/23/19   [provider]  methylphenidate (RITALIN) 5 MG tablet Take 7.5-10 mg by mouth as needed.    [provider]  norgestimate-ethinyl estradiol (ORTHO-CYCLEN) 0.25-35 MG-MCG tablet Take 1 tablet by mouth daily. 05/28/16   [provider]  ofloxacin (FLOXIN) 0.3 % OTIC solution Place 10 drops into the right ear daily. Use 10 drops 1 time daily to the right ear for the next 7 days. 04/10/22   Carlisle Beers, FNP  predniSONE (STERAPRED UNI-PAK 21 TAB) 10 MG (21) TBPK tablet Take by mouth daily. Take 6 tabs by mouth daily  for 2 days, then 5 tabs for 2 days, then 4 tabs for 2 days, then 3 tabs for 2 days, 2 tabs for 2 days, then 1 tab by mouth daily for 2 days 06/26/22   Trevor Iha, FNP  promethazine-dextromethorphan (PROMETHAZINE-DM) 6.25-15 MG/5ML syrup Take 5 mLs by mouth 2 (two) times daily as needed for cough. 06/26/22   Trevor Iha, FNP  Pseudoeph-Doxylamine-DM-APAP (DAYQUIL/NYQUIL COLD/FLU RELIEF PO) Take by mouth.    [provider]      Allergies  Peanut-containing drug products, Amoxicillin, Nickel, Other, and Justicia adhatoda (malabar nut tree) [justicia adhatoda]    Review of Systems   Review of Systems  Gastrointestinal:  Positive for abdominal pain and nausea.  All other systems reviewed and are negative.   Physical Exam Updated Vital Signs BP 118/70 (BP Location: Left Arm)   Pulse 93   Temp 98.8 F (37.1 C) (Oral)   Resp 16   LMP 10/29/2022   SpO2 97%  Physical Exam Vitals and nursing note reviewed. Exam  conducted with a chaperone present (Anslee, NT).  Constitutional:      General: She is not in acute distress.    Appearance: She is well-developed.     Comments: Appears uncomfortable  HENT:     Head: Normocephalic and atraumatic.  Eyes:     Conjunctiva/sclera: Conjunctivae normal.  Cardiovascular:     Rate and Rhythm: Normal rate and regular rhythm.     Heart sounds: No murmur heard. Pulmonary:     Effort: Pulmonary effort is normal. No respiratory distress.     Breath sounds: Normal breath sounds. No wheezing, rhonchi or rales.  Abdominal:     General: Abdomen is flat.     Palpations: Abdomen is soft.     Tenderness: There is abdominal tenderness in the right upper quadrant and right lower quadrant. There is right CVA tenderness and guarding. There is no left CVA tenderness. Positive signs include Rovsing's sign, McBurney's sign and obturator sign. Negative signs include Murphy's sign and psoas sign.     Comments: Abdominal rashes.  Positive heeltap test  Genitourinary:    General: Normal vulva.     Exam position: Lithotomy position.     Pubic Area: No rash.      Vagina: Normal. No vaginal discharge.     Cervix: Normal.     Adnexa: Left adnexa normal.       Right: Tenderness present. No mass or fullness.    Musculoskeletal:        General: No swelling.     Cervical back: Neck supple.  Skin:    General: Skin is warm and dry.     Capillary Refill: Capillary refill takes less than 2 seconds.  Neurological:     Mental Status: She is alert.  Psychiatric:        Mood and Affect: Mood normal.     ED Results / Procedures / Treatments   Labs (all labs ordered are listed, but only abnormal results are displayed) Labs Reviewed  CBC WITH DIFFERENTIAL/PLATELET - Abnormal; Notable for the following components:      Result Value   WBC 11.5 (*)    RDW 11.3 (*)    All other components within normal limits  WET PREP, GENITAL  COMPREHENSIVE METABOLIC PANEL  LIPASE, BLOOD  RPR   HIV ANTIBODY (ROUTINE TESTING W REFLEX)  GC/CHLAMYDIA PROBE AMP ([place]) NOT AT [facility]    EKG None  Radiology US PELVIC COMPLETE W TRANSVAGINAL AND TORSION R/O  Result Date: 11/13/2022 CLINICAL DATA:  Right lower quadrant pain.  LMP 10/29/2022. EXAM: TRANSABDOMINAL AND TRANSVAGINAL ULTRASOUND OF PELVIS DOPPLER ULTRASOUND OF OVARIES TECHNIQUE: Both transabdominal and transvaginal ultrasound examinations of the pelvis were performed. Transabdominal technique was performed for global imaging of the pelvis including uterus, ovaries, adnexal regions, and pelvic cul-de-sac. It was necessary to proceed with endovaginal exam following the transabdominal exam to visualize the uterus, endometrium, and ovaries. Color and duplex Doppler ultrasound was utilized to evaluate blood flow to the ovaries.  COMPARISON:  CT abdomen and pelvis 11/13/2022 FINDINGS: Uterus Measurements: 5.6 x 3.1 x 3.6 cm = volume: 33 mL. No fibroids or other mass visualized. Endometrium Thickness: 7 mm.  No focal abnormality visualized. Right ovary Measurements: 3.1 x 2.5 x 2.9 cm = volume: 12 mL. Normal with a 2.2 cm dominant follicle. Left ovary Measurements: 3.4 x 1.8 x 3.0 cm = volume: 9 mL. Normal appearance/no adnexal mass. Pulsed Doppler evaluation of both ovaries demonstrates normal low-resistance arterial and venous waveforms. Other findings Small volume free fluid is likely physiologic. IMPRESSION: Normal pelvic ultrasound. No evidence of ovarian torsion. Electronically Signed   By: Minerva Fester M.D.   On: 11/13/2022 17:52   CT ABDOMEN PELVIS W CONTRAST  Result Date: 11/13/2022 CLINICAL DATA:  Right lower quadrant abdominal pain. EXAM: CT ABDOMEN AND PELVIS WITH CONTRAST TECHNIQUE: Multidetector CT imaging of the abdomen and pelvis was performed using the standard protocol following bolus administration of intravenous contrast. RADIATION DOSE REDUCTION: This exam was performed according to the departmental dose-optimization  program which includes automated exposure control, adjustment of the mA and/or kV according to patient size and/or use of iterative reconstruction technique. CONTRAST:  80mL OMNIPAQUE IOHEXOL 300 MG/ML  SOLN COMPARISON:  None Available. FINDINGS: Lower chest: No acute abnormality. Hepatobiliary: No focal liver abnormality is seen. No gallstones, gallbladder wall thickening, or biliary dilatation. Pancreas: Unremarkable. No pancreatic ductal dilatation or surrounding inflammatory changes. Spleen: Normal. Adrenals/Urinary Tract: Adrenal glands are unremarkable. Kidneys are normal, without renal calculi, focal lesion, or hydronephrosis. Bladder is unremarkable. Stomach/Bowel: Normal stomach and duodenum. No dilated loops of small bowel. Normal appendix is visualized on coronal image 49 series 5. No bowel wall thickening or surrounding inflammation. Vascular/Lymphatic: No significant vascular findings are present. No enlarged abdominal or pelvic lymph nodes. Reproductive: Uterus and bilateral adnexa are unremarkable. Other: No abdominal wall hernia or abnormality. No abdominopelvic ascites. Musculoskeletal: No acute or significant osseous findings. IMPRESSION: No acute abnormality of the abdomen or pelvis. Normal appendix. Electronically Signed   By: Orvan Falconer M.D.   On: 11/13/2022 16:51    Procedures Procedures    Medications Ordered in ED Medications  morphine (PF) 4 MG/ML injection 4 mg (4 mg Intravenous Given 11/13/22 1539)  ondansetron (ZOFRAN) injection 4 mg (4 mg Intravenous Given 11/13/22 1538)  iohexol (OMNIPAQUE) 300 MG/ML solution 80 mL (80 mLs Intravenous Contrast Given 11/13/22 1615)  ketorolac (TORADOL) 15 MG/ML injection 15 mg (15 mg Intravenous Given 11/13/22 1824)    ED Course/ Medical Decision Making/ A&P                             Medical Decision Making Amount and/or Complexity of Data Reviewed Labs: ordered. Radiology: ordered.  Risk Prescription drug management.  This  patient presents to the ED for concern of RLQ abdominal pain, this involves an extensive number of treatment options, and is a complaint that carries with it a high risk of complications and morbidity. The differential diagnosis for generalized abdominal pain includes, but is not limited to AAA, gastroenteritis, appendicitis, Bowel obstruction, Bowel perforation. Gastroparesis, DKA, Hernia, Inflammatory bowel disease, mesenteric ischemia, pancreatitis, peritonitis SBP, volvulus.   Co morbidities that complicate the patient evaluation   asthma, bipolar 1 disorder  My initial workup includes labs, pain control, nausea control, CT abdomen pelvis  Additional history obtained from: Nursing notes from this visit. Family mother is at bedside and provides a portion of the history  I ordered,  reviewed and interpreted labs which include: CBC, CMP, lipase.  I also reviewed labs from 10 PM yesterday including CBC, CMP, lipase, urinalysis, hCG, STI panel  I ordered imaging studies including CT abdomen pelvis, ultrasound pelvis I independently visualized and interpreted imaging which showed Normal CT and ultrasound of the abdomen and pelvis I agree with the radiologist interpretation  Afebrile, hemodynamically stable.  27 year old female presenting to the ED for evaluation of right lower quadrant abdominal pain.  Began 3 days ago and has progressively gotten worse.  Her lab workup was overall very reassuring.  Her leukocytosis is trending down from yesterday.  Her CT scan was negative for appendicitis or other acute abnormalities.  Ultrasound negative for torsion.  Pelvic exam was overall reassuring.  She has some right adnexal tenderness but no cervical motion tenderness.  Do not believe this is PID.  Likely ruptured ovarian cyst.  She reported improvement in symptoms in the ED.  She was encouraged to follow-up with her primary care provider.  She was educated on appropriate use of Tylenol and ibuprofen at home  for pain.  She was given return precautions.  Stable at discharge.  At this time there does not appear to be any evidence of an acute emergency medical condition and the patient appears stable for discharge with appropriate outpatient follow up. Diagnosis was discussed with patient who verbalizes understanding of care plan and is agreeable to discharge. I have discussed return precautions with patient and mother who verbalizes understanding. Patient encouraged to follow-up with their PCP within 1 week. All questions answered.  Note: Portions of this report may have been transcribed using voice recognition software. Every effort was made to ensure accuracy; however, inadvertent computerized transcription errors may still be present.        Final Clinical Impression(s) / ED Diagnoses Final diagnoses:  Right lower quadrant abdominal pain    Rx / DC Orders ED Discharge Orders     None         Mora Bellman 11/13/22 1907    Charlynne Pander, MD 11/13/22 2046

## 2022-11-13 NOTE — ED Triage Notes (Signed)
Pt reports RUQ and RLQ pain x 3d, now radiating to back; LWBS from [facility] last night, went to [place] today and was referred here; labs and UA completed at [facility]

## 2022-11-13 NOTE — ED Notes (Signed)
Pt in imaging

## 2022-11-13 NOTE — ED Notes (Signed)
D/c paperwork reviewed with pt, including follow up care.  All questions and/or concerns addressed at time of d/c.  No further needs expressed. . Pt verbalized understanding, Ambulatory with family to ED exit, NAD.   

## 2022-11-14 LAB — HIV ANTIBODY (ROUTINE TESTING W REFLEX): HIV Screen 4th Generation wRfx: NONREACTIVE

## 2022-11-14 LAB — RPR: RPR Ser Ql: NONREACTIVE

## 2022-11-15 DIAGNOSIS — F902 Attention-deficit hyperactivity disorder, combined type: Secondary | ICD-10-CM | POA: Diagnosis not present

## 2022-11-15 DIAGNOSIS — F3181 Bipolar II disorder: Secondary | ICD-10-CM | POA: Diagnosis not present

## 2022-11-15 DIAGNOSIS — F411 Generalized anxiety disorder: Secondary | ICD-10-CM | POA: Diagnosis not present

## 2022-11-15 LAB — GC/CHLAMYDIA PROBE AMP (~~LOC~~) NOT AT ARMC
Chlamydia: NEGATIVE
Comment: NEGATIVE
Comment: NORMAL
Neisseria Gonorrhea: NEGATIVE

## 2022-12-15 DIAGNOSIS — R6883 Chills (without fever): Secondary | ICD-10-CM | POA: Diagnosis not present

## 2022-12-15 DIAGNOSIS — M791 Myalgia, unspecified site: Secondary | ICD-10-CM | POA: Diagnosis not present

## 2022-12-15 DIAGNOSIS — J029 Acute pharyngitis, unspecified: Secondary | ICD-10-CM | POA: Diagnosis not present

## 2023-01-23 ENCOUNTER — Telehealth: Payer: BC Managed Care – PPO | Admitting: Physician Assistant

## 2023-01-23 ENCOUNTER — Encounter: Payer: Self-pay | Admitting: Physician Assistant

## 2023-01-23 DIAGNOSIS — U071 COVID-19: Secondary | ICD-10-CM | POA: Diagnosis not present

## 2023-01-23 MED ORDER — NIRMATRELVIR/RITONAVIR (PAXLOVID)TABLET: 3.0000 | ORAL_TABLET | Freq: Two times a day (BID) | ORAL | 0 refills | Status: DC

## 2023-01-23 MED ORDER — NIRMATRELVIR/RITONAVIR (PAXLOVID)TABLET
3.0000 | ORAL_TABLET | Freq: Two times a day (BID) | ORAL | 0 refills | Status: AC
Start: 2023-01-23 — End: 2023-01-28

## 2023-01-23 MED ORDER — BENZONATATE 100 MG PO CAPS: 100.0000 mg | ORAL_CAPSULE | Freq: Three times a day (TID) | ORAL | 0 refills | Status: DC | PRN

## 2023-01-23 MED ORDER — PROMETHAZINE HCL 12.5 MG PO TABS: 12.5000 mg | ORAL_TABLET | Freq: Three times a day (TID) | ORAL | 0 refills | Status: DC | PRN

## 2023-01-23 MED ORDER — PROMETHAZINE HCL 12.5 MG PO TABS
12.5000 mg | ORAL_TABLET | Freq: Three times a day (TID) | ORAL | 0 refills | Status: AC | PRN
Start: 2023-01-23 — End: ?

## 2023-01-23 MED ORDER — BENZONATATE 100 MG PO CAPS
100.0000 mg | ORAL_CAPSULE | Freq: Three times a day (TID) | ORAL | 0 refills | Status: AC | PRN
Start: 2023-01-23 — End: ?

## 2023-01-23 NOTE — Patient Instructions (Signed)
[name], thank you for joining Tylene Fantasia Ward, PA-C for today's virtual visit.  While this provider is not your primary care provider (PCP), if your PCP is located in our provider database this encounter information will be shared with them immediately following your visit.   A [facility] MyChart account gives you access to today's visit and all your visits, tests, and labs performed at [facility] " click here if you don't have a  MyChart account or go to [email or web address]  Consent: (Patient) Megan Bernard provided verbal consent for this virtual visit at the beginning of the encounter.  Current Medications:  Current Outpatient Medications:    albuterol (PROVENTIL) (2.5 MG/3ML) 0.083% nebulizer solution, as needed., Disp: , Rfl:    albuterol (VENTOLIN HFA) 108 (90 Base) MCG/ACT inhaler, as needed., Disp: , Rfl:    beclomethasone (QVAR REDIHALER) 40 MCG/ACT inhaler, as needed., Disp: , Rfl:    benzonatate (TESSALON) 100 MG capsule, Take 1 capsule (100 mg total) by mouth 3 (three) times daily as needed., Disp: 20 capsule, Rfl: 0   buPROPion (WELLBUTRIN XL) 150 MG 24 hr tablet, daily., Disp: , Rfl:    buPROPion (WELLBUTRIN XL) 300 MG 24 hr tablet, daily., Disp: , Rfl:    EPINEPHrine (EPIPEN JR) 0.15 MG/0.3ML injection, Inject into the muscle as needed., Disp: , Rfl:    fexofenadine (ALLEGRA ALLERGY) 180 MG tablet, Take 1 tablet (180 mg total) by mouth daily for 15 days., Disp: 15 tablet, Rfl: 0   hydrOXYzine (ATARAX/VISTARIL) 25 MG tablet, as needed., Disp: , Rfl:    Lactobacillus-Inulin (CULTURELLE DIGESTIVE DAILY) CAPS, Take by mouth daily at 6 (six) AM., Disp: , Rfl:    lamoTRIgine (LAMICTAL) 200 MG tablet, Take 200 mg by mouth daily., Disp: , Rfl:    LORazepam (ATIVAN) 0.5 MG tablet, as needed., Disp: , Rfl:    methylphenidate (RITALIN) 5 MG tablet, Take 7.5-10 mg by mouth as needed., Disp: , Rfl:    nirmatrelvir/ritonavir (PAXLOVID) 20 x 150 MG & 10 x  100MG  TABS, Take 3 tablets by mouth 2 (two) times daily for 5 days. (Take nirmatrelvir 150 mg two tablets twice daily for 5 days and ritonavir 100 mg one tablet twice daily for 5 days) Patient GFR is >60, Disp: 30 tablet, Rfl: 0   norgestimate-ethinyl estradiol (ORTHO-CYCLEN) 0.25-35 MG-MCG tablet, Take 1 tablet by mouth daily., Disp: , Rfl:    ofloxacin (FLOXIN) 0.3 % OTIC solution, Place 10 drops into the right ear daily. Use 10 drops 1 time daily to the right ear for the next 7 days., Disp: 5 mL, Rfl: 0   predniSONE (STERAPRED UNI-PAK 21 TAB) 10 MG (21) TBPK tablet, Take by mouth daily. Take 6 tabs by mouth daily  for 2 days, then 5 tabs for 2 days, then 4 tabs for 2 days, then 3 tabs for 2 days, 2 tabs for 2 days, then 1 tab by mouth daily for 2 days, Disp: 42 tablet, Rfl: 0   promethazine (PHENERGAN) 12.5 MG tablet, Take 1 tablet (12.5 mg total) by mouth every 8 (eight) hours as needed for nausea or vomiting., Disp: 20 tablet, Rfl: 0   promethazine-dextromethorphan (PROMETHAZINE-DM) 6.25-15 MG/5ML syrup, Take 5 mLs by mouth 2 (two) times daily as needed for cough., Disp: 118 mL, Rfl: 0   Pseudoeph-Doxylamine-DM-APAP (DAYQUIL/NYQUIL COLD/FLU RELIEF PO), Take by mouth., Disp: , Rfl:    Medications ordered in this encounter:  Meds ordered this encounter  Medications   DISCONTD: nirmatrelvir/ritonavir (PAXLOVID) 20 x  150 MG & 10 x 100MG  TABS    Sig: Take 3 tablets by mouth 2 (two) times daily for 5 days. (Take nirmatrelvir 150 mg two tablets twice daily for 5 days and ritonavir 100 mg one tablet twice daily for 5 days) Patient GFR is >60    Dispense:  30 tablet    Refill:  0    Order Specific Question:   Supervising Provider    Answer:   Merrilee Jansky [[id number]]   DISCONTD: promethazine (PHENERGAN) 12.5 MG tablet    Sig: Take 1 tablet (12.5 mg total) by mouth every 8 (eight) hours as needed for nausea or vomiting.    Dispense:  20 tablet    Refill:  0    Order Specific Question:    Supervising Provider    Answer:   Merrilee Jansky [[id number]]   DISCONTD: benzonatate (TESSALON) 100 MG capsule    Sig: Take 1 capsule (100 mg total) by mouth 3 (three) times daily as needed.    Dispense:  20 capsule    Refill:  0    Order Specific Question:   Supervising Provider    Answer:   Merrilee Jansky [[id number]]   benzonatate (TESSALON) 100 MG capsule    Sig: Take 1 capsule (100 mg total) by mouth 3 (three) times daily as needed.    Dispense:  20 capsule    Refill:  0    Order Specific Question:   Supervising Provider    Answer:   Loreli Dollar   nirmatrelvir/ritonavir (PAXLOVID) 20 x 150 MG & 10 x 100MG  TABS    Sig: Take 3 tablets by mouth 2 (two) times daily for 5 days. (Take nirmatrelvir 150 mg two tablets twice daily for 5 days and ritonavir 100 mg one tablet twice daily for 5 days) Patient GFR is >60    Dispense:  30 tablet    Refill:  0    Order Specific Question:   Supervising Provider    Answer:   Merrilee Jansky [[id number]]   promethazine (PHENERGAN) 12.5 MG tablet    Sig: Take 1 tablet (12.5 mg total) by mouth every 8 (eight) hours as needed for nausea or vomiting.    Dispense:  20 tablet    Refill:  0    Order Specific Question:   Supervising Provider    Answer:   Merrilee Jansky [id number]     *If you need refills on other medications prior to your next appointment, please contact your pharmacy*  Follow-Up: Call back or seek an in-person evaluation if the symptoms worsen or if the condition fails to improve as anticipated.  [facility] [phone]  Other Instructions Take paxlovid as prescribed.  Continue with inhaler as needed.  Can take tessalon as needed for cough.  Can take promethazine as needed for nausea.  Drink plenty of fluids, rest. Follow up for in person evaluation if you develop new or worsening symptoms.    If you have been instructed to have an in-person evaluation today at a local Urgent Care facility, please  use the link below. It will take you to a list of all of our available [place] [facility], including address, phone number and hours of operation. Please do not delay care.  [place] [facility]  If you or a family member do not have a primary care provider, use the link below to schedule a visit and establish care. When you choose a [facility] primary care physician or advanced practice provider, you gain a long-term partner in health. Find a Primary Care Provider  Learn more about [place]'s in-office and virtual care options: [place] - Get Care Now

## 2023-01-23 NOTE — Progress Notes (Signed)
Virtual Visit Consent   Megan Bernard, you are scheduled for a virtual visit with a [facility] provider today. Just as with appointments in the office, your consent must be obtained to participate. Your consent will be active for this visit and any virtual visit you may have with one of our providers in the next 365 days. If you have a MyChart account, a copy of this consent can be sent to you electronically.  As this is a virtual visit, video technology does not allow for your provider to perform a traditional examination. This may limit your provider's ability to fully assess your condition. If your provider identifies any concerns that need to be evaluated in person or the need to arrange testing (such as labs, EKG, etc.), we will make arrangements to do so. Although advances in technology are sophisticated, we cannot ensure that it will always work on either your end or our end. If the connection with a video visit is poor, the visit may have to be switched to a telephone visit. With either a video or telephone visit, we are not always able to ensure that we have a secure connection.  By engaging in this virtual visit, you consent to the provision of healthcare and authorize for your insurance to be billed (if applicable) for the services provided during this visit. Depending on your insurance coverage, you may receive a charge related to this service.  I need to obtain your verbal consent now. Are you willing to proceed with your visit today? Megan Bernard has provided verbal consent on 01/23/2023 for a virtual visit (video or telephone). Tylene Fantasia Ward, PA-C  Date: 01/23/2023 7:29 PM  Virtual Visit via Video Note   I, Tylene Fantasia Ward, connected with  Megan Bernard  ([id number], [birth year]) on 01/23/23 at  7:30 PM EDT by a video-enabled telemedicine application and verified that I am speaking with the correct person using two identifiers.  Location: Patient: Virtual Visit Location Patient:  Home Provider: Virtual Visit Location Provider: Home   I discussed the limitations of evaluation and management by telemedicine and the availability of in person appointments. The patient expressed understanding and agreed to proceed.    History of Present Illness: Megan Bernard is a 27 y.o. who identifies as a female who was assigned female at birth, and is being seen today for fever, body aches, chills, congestion, headache, cough that started yesterday. Took a COVID test today which was positive.  She is currently taking nyquil and dayquil, afrin with minimal.  She denies wheezing.  She has a h/o asthma, does have an inhaler which has provided minimal.  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   History of COVID-19 06/22/2021   Tachycardia 06/22/2021   Physical deconditioning 06/22/2021   History of asthma 06/22/2021   Shortness of breath 06/22/2021   Chronic cough 06/22/2021    Allergies:  Allergies  Allergen Reactions   Peanut-Containing Drug Products Anaphylaxis and Rash   Amoxicillin Rash   Nickel Dermatitis    asthmarash  asthmarash asthmarash asthmarash  asthmarash asthmarash    Other Rash    Nuts Nickel Nuts Nickel Nuts Nickel Nuts Nickel    Justicia Adhatoda (Malabar Nut Tree) [Justicia Adhatoda] Rash    All nuts   Medications:  Current Outpatient Medications:    benzonatate (TESSALON) 100 MG capsule, Take 1 capsule (100 mg total) by mouth 3 (three)  times daily as needed., Disp: 20 capsule, Rfl: 0   nirmatrelvir/ritonavir (PAXLOVID) 20 x 150 MG & 10 x 100MG  TABS, Take 3 tablets by mouth 2 (two) times daily for 5 days. (Take nirmatrelvir 150 mg two tablets twice daily for 5 days and ritonavir 100 mg one tablet twice daily for 5 days) Patient GFR is >60, Disp: 30 tablet, Rfl: 0   promethazine (PHENERGAN) 12.5 MG tablet, Take 1 tablet (12.5 mg total) by mouth  every 8 (eight) hours as needed for nausea or vomiting., Disp: 20 tablet, Rfl: 0   albuterol (PROVENTIL) (2.5 MG/3ML) 0.083% nebulizer solution, as needed., Disp: , Rfl:    albuterol (VENTOLIN HFA) 108 (90 Base) MCG/ACT inhaler, as needed., Disp: , Rfl:    beclomethasone (QVAR REDIHALER) 40 MCG/ACT inhaler, as needed., Disp: , Rfl:    buPROPion (WELLBUTRIN XL) 150 MG 24 hr tablet, daily., Disp: , Rfl:    buPROPion (WELLBUTRIN XL) 300 MG 24 hr tablet, daily., Disp: , Rfl:    EPINEPHrine (EPIPEN JR) 0.15 MG/0.3ML injection, Inject into the muscle as needed., Disp: , Rfl:    fexofenadine (ALLEGRA ALLERGY) 180 MG tablet, Take 1 tablet (180 mg total) by mouth daily for 15 days., Disp: 15 tablet, Rfl: 0   hydrOXYzine (ATARAX/VISTARIL) 25 MG tablet, as needed., Disp: , Rfl:    Lactobacillus-Inulin (CULTURELLE DIGESTIVE DAILY) CAPS, Take by mouth daily at 6 (six) AM., Disp: , Rfl:    lamoTRIgine (LAMICTAL) 200 MG tablet, Take 200 mg by mouth daily., Disp: , Rfl:    LORazepam (ATIVAN) 0.5 MG tablet, as needed., Disp: , Rfl:    methylphenidate (RITALIN) 5 MG tablet, Take 7.5-10 mg by mouth as needed., Disp: , Rfl:    norgestimate-ethinyl estradiol (ORTHO-CYCLEN) 0.25-35 MG-MCG tablet, Take 1 tablet by mouth daily., Disp: , Rfl:    ofloxacin (FLOXIN) 0.3 % OTIC solution, Place 10 drops into the right ear daily. Use 10 drops 1 time daily to the right ear for the next 7 days., Disp: 5 mL, Rfl: 0   predniSONE (STERAPRED UNI-PAK 21 TAB) 10 MG (21) TBPK tablet, Take by mouth daily. Take 6 tabs by mouth daily  for 2 days, then 5 tabs for 2 days, then 4 tabs for 2 days, then 3 tabs for 2 days, 2 tabs for 2 days, then 1 tab by mouth daily for 2 days, Disp: 42 tablet, Rfl: 0   promethazine-dextromethorphan (PROMETHAZINE-DM) 6.25-15 MG/5ML syrup, Take 5 mLs by mouth 2 (two) times daily as needed for cough., Disp: 118 mL, Rfl: 0   Pseudoeph-Doxylamine-DM-APAP (DAYQUIL/NYQUIL COLD/FLU RELIEF PO), Take by mouth., Disp: ,  Rfl:   Observations/Objective: Patient is well-developed, well-nourished in no acute distress.  Resting comfortably at home.  Head is normocephalic, atraumatic.  No labored breathing.  Speech is clear and coherent with logical content.  Patient is alert and oriented at baseline.   Assessment and Plan: 1. COVID - nirmatrelvir/ritonavir (PAXLOVID) 20 x 150 MG & 10 x 100MG  TABS; Take 3 tablets by mouth 2 (two) times daily for 5 days. (Take nirmatrelvir 150 mg two tablets twice daily for 5 days and ritonavir 100 mg one tablet twice daily for 5 days) Patient GFR is >60  Dispense: 30 tablet; Refill: 0 - promethazine (PHENERGAN) 12.5 MG tablet; Take 1 tablet (12.5 mg total) by mouth every 8 (eight) hours as needed for nausea or vomiting.  Dispense: 20 tablet; Refill: 0 - benzonatate (TESSALON) 100 MG capsule; Take 1 capsule (100 mg total) by mouth 3 (  three) times daily as needed.  Dispense: 20 capsule; Refill: 0  Discussed supportive care.   Follow Up Instructions: I discussed the assessment and treatment plan with the patient. The patient was provided an opportunity to ask questions and all were answered. The patient agreed with the plan and demonstrated an understanding of the instructions.  A copy of instructions were sent to the patient via MyChart unless otherwise noted below.    The patient was advised to call back or seek an in-person evaluation if the symptoms worsen or if the condition fails to improve as anticipated.  Time:  I spent 7 minutes with the patient via telehealth technology discussing the above problems/concerns.    Tylene Fantasia Ward, PA-C

## 2023-02-04 ENCOUNTER — Ambulatory Visit (HOSPITAL_COMMUNITY)
Admission: RE | Admit: 2023-02-04 | Discharge: 2023-02-04 | Disposition: A | Discharge status: Home / Self Care | Payer: BC Managed Care – PPO | Source: Ambulatory Visit | Attending: Emergency Medicine | Admitting: Emergency Medicine

## 2023-02-04 ENCOUNTER — Encounter (HOSPITAL_COMMUNITY): Payer: Self-pay

## 2023-02-04 ENCOUNTER — Ambulatory Visit (HOSPITAL_COMMUNITY): Payer: BC Managed Care – PPO

## 2023-02-04 VITALS — BP 118/82 | HR 113 | Temp 98.5°F | Resp 19

## 2023-02-04 DIAGNOSIS — U071 COVID-19: Secondary | ICD-10-CM | POA: Diagnosis not present

## 2023-02-04 DIAGNOSIS — J069 Acute upper respiratory infection, unspecified: Secondary | ICD-10-CM | POA: Diagnosis not present

## 2023-02-04 DIAGNOSIS — R059 Cough, unspecified: Secondary | ICD-10-CM

## 2023-02-04 HISTORY — DX: Bicuspid aortic valve: Q23.81

## 2023-02-04 LAB — POCT URINE PREGNANCY: Preg Test, Ur: NEGATIVE

## 2023-02-04 NOTE — Discharge Instructions (Addendum)
Your x-ray was negative for pneumonia.  It is not uncommon to have continued coughing after a viral upper respiratory infection for several weeks.  May take over the counter meds for symptom management. Follow up with PCP as needed.

## 2023-02-04 NOTE — ED Provider Notes (Signed)
[facility]    CSN: [id number] Arrival date & time: 02/04/23  1416      History   Chief Complaint Chief Complaint  Patient presents with   Cough    I had covid two weeks ago and now I think I have pneumonia. I have bad chest pains, dry cough, trouble breathing, and fatigue. - Entered by patient    HPI Megan Bernard is a 27 y.o. female.   27 year old female, Megan Bernard, presents to urgent care for evaluation of cough that she has had for 2 weeks.  Patient tested positive for COVID and took antivirals at that time, patient is worried that she may have pneumonia.  The history is provided by the patient. No language interpreter was used.    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      Home Medications    Prior to Admission medications   Medication Sig Start Date End Date Taking? Authorizing Provider  albuterol (PROVENTIL) (2.5 MG/3ML) 0.083% nebulizer solution as needed. 03/11/14   [provider]  albuterol (VENTOLIN HFA) 108 (90 Base) MCG/ACT inhaler as needed. 05/31/17   [provider]  beclomethasone (QVAR REDIHALER) 40 MCG/ACT inhaler as needed. 11/03/20   [provider]  benzonatate (TESSALON) 100 MG capsule Take 1 capsule (100 mg total) by mouth 3 (three) times daily as needed. 01/23/23   Ward, Tylene Fantasia, PA-C  buPROPion (WELLBUTRIN XL) 150 MG 24 hr tablet daily. 06/29/17   [provider]   buPROPion (WELLBUTRIN XL) 300 MG 24 hr tablet daily. 02/06/18   [provider]  EPINEPHrine (EPIPEN JR) 0.15 MG/0.3ML injection Inject into the muscle as needed.    [provider]  fexofenadine (ALLEGRA ALLERGY) 180 MG tablet Take 1 tablet (180 mg total) by mouth daily for 15 days. 06/26/22 07/11/22  Trevor Iha, FNP  hydrOXYzine (ATARAX/VISTARIL) 25 MG tablet as needed. 03/15/16   [provider]  Lactobacillus-Inulin (CULTURELLE DIGESTIVE DAILY) CAPS Take by mouth daily at 6 (six) AM.    [provider]  lamoTRIgine (LAMICTAL) 200 MG tablet Take 200 mg by mouth daily.    [provider]  LORazepam (ATIVAN) 0.5 MG tablet as needed. 07/23/19   [provider]  methylphenidate (RITALIN) 5 MG tablet Take 7.5-10 mg by mouth as needed.    [provider]  norgestimate-ethinyl estradiol (ORTHO-CYCLEN) 0.25-35 MG-MCG tablet Take 1 tablet by mouth daily. 05/28/16   [provider]  ofloxacin (FLOXIN) 0.3 % OTIC solution Place 10 drops into the right ear daily. Use 10 drops 1 time daily to the right ear for the next 7 days. 04/10/22   Carlisle Beers, FNP  predniSONE (STERAPRED UNI-PAK 21 TAB) 10 MG (21) TBPK tablet Take by mouth daily. Take 6 tabs by mouth daily  for 2 days, then 5 tabs for 2 days, then 4 tabs for 2 days, then  3 tabs for 2 days, 2 tabs for 2 days, then 1 tab by mouth daily for 2 days 06/26/22   Trevor Iha, FNP  promethazine (PHENERGAN) 12.5 MG tablet Take 1 tablet (12.5 mg total) by mouth every 8 (eight) hours as needed for nausea or vomiting. 01/23/23   Ward, Tylene Fantasia, PA-C  promethazine-dextromethorphan (PROMETHAZINE-DM) 6.25-15 MG/5ML syrup Take 5 mLs by mouth 2 (two) times daily as needed for cough. 06/26/22   Trevor Iha, FNP  Pseudoeph-Doxylamine-DM-APAP (DAYQUIL/NYQUIL COLD/FLU RELIEF PO) Take by mouth.    [provider]    Family History Family History  Problem Relation Age of Onset    Hypertension Mother    Diabetes Mother    Heart disease Mother    Fibromyalgia Mother    Healthy Father     Social History Social History   Tobacco Use   Smoking status: Never   Smokeless tobacco: Never  Vaping Use   Vaping status: Never Used  Substance Use Topics   Alcohol use: Not Currently   Drug use: Not Currently     Allergies   Peanut-containing drug products, Amoxicillin, Nickel, Other, and Justicia adhatoda (malabar nut tree) [justicia adhatoda]   Review of Systems Review of Systems  Constitutional:  Negative for fever.  Respiratory:  Positive for cough. Negative for shortness of breath, wheezing and stridor.        Rib pain  All other systems reviewed and are negative.    Physical Exam Triage Vital Signs ED Triage Vitals  Encounter Vitals Group     BP 02/04/23 1455 118/82     Systolic BP Percentile --      Diastolic BP Percentile --      Pulse Rate 02/04/23 1455 (!) 113     Resp 02/04/23 1455 19     Temp 02/04/23 1455 98.5 F (36.9 C)     Temp src --      SpO2 02/04/23 1455 99 %     Weight --      Height --      Head Circumference --      Peak Flow --      Pain Score 02/04/23 1453 6     Pain Loc --      Pain Education --      Exclude from Growth Chart --    No data found.  Updated Vital Signs BP 118/82   Pulse (!) 113   Temp 98.5 F (36.9 C)   Resp 19   LMP 01/21/2023   SpO2 99%   Visual Acuity Right Eye Distance:   Left Eye Distance:   Bilateral Distance:    Right Eye Near:   Left Eye Near:    Bilateral Near:     Physical Exam Vitals and nursing note reviewed.  Cardiovascular:     Rate and Rhythm: Regular rhythm. Tachycardia present.     Pulses: Normal pulses.     Heart sounds: Normal heart sounds.  Pulmonary:     Effort: Pulmonary effort is normal.     Breath sounds: Normal breath sounds and air entry.     Comments: Sat 99% on RA Neurological:     General: No focal deficit present.     Mental Status: She is alert and  oriented to person, place, and time.     GCS: GCS eye subscore is 4. GCS verbal subscore is 5. GCS motor subscore is 6.  Psychiatric:        Attention and Perception: Attention normal.  Mood and Affect: Mood normal.        Speech: Speech normal.        Behavior: Behavior normal.      UC Treatments / Results  Labs (all labs ordered are listed, but only abnormal results are displayed) Labs Reviewed  POCT URINE PREGNANCY    EKG   Radiology DG Chest 2 View  Result Date: 02/04/2023 CLINICAL DATA:  Cough EXAM: CHEST - 2 VIEW COMPARISON:  Chest radiograph 06/23/2022 FINDINGS: The cardiomediastinal silhouette is normal There is no focal consolidation or pulmonary edema. There is no pleural effusion or pneumothorax There is no acute osseous abnormality. IMPRESSION: No radiographic evidence of acute cardiopulmonary process. Electronically Signed   By: Lesia Hausen M.D.   On: 02/04/2023 15:35    Procedures Procedures (including critical care time)  Medications Ordered in UC Medications - No data to display  Initial Impression / Assessment and Plan / UC Course  I have reviewed the triage vital signs and the nursing notes.  Pertinent labs & imaging results that were available during my care of the patient were reviewed by me and considered in my medical decision making (see chart for details).     Ddx: Viral URI w cough, Long haul covid, allergies Final Clinical Impressions(s) / UC Diagnoses   Final diagnoses:  Viral URI with cough     Discharge Instructions      Your x-ray was negative for pneumonia.  It is not uncommon to have continued coughing after a viral upper respiratory infection for several weeks.  May take over the counter meds for symptom management. Follow up with PCP as needed.        ED Prescriptions   None    PDMP not reviewed this encounter.   Clancy Gourd, NP 02/04/23 [phone]

## 2023-02-04 NOTE — ED Triage Notes (Signed)
Pt tested positive for covid 2 weeks ago. She took antivirals. She was feeling better and now she is having pain in her ribs (chest and back) and dry cough. Oxygen level at home was 91 this morning, and heart rate was 125.

## 2023-02-07 DIAGNOSIS — R053 Chronic cough: Secondary | ICD-10-CM | POA: Diagnosis not present

## 2023-02-07 DIAGNOSIS — R000 Tachycardia, unspecified: Secondary | ICD-10-CM | POA: Diagnosis not present

## 2023-02-07 DIAGNOSIS — Z13220 Encounter for screening for lipoid disorders: Secondary | ICD-10-CM | POA: Diagnosis not present

## 2023-02-07 DIAGNOSIS — E669 Obesity, unspecified: Secondary | ICD-10-CM | POA: Diagnosis not present

## 2023-02-08 DIAGNOSIS — Z13220 Encounter for screening for lipoid disorders: Secondary | ICD-10-CM | POA: Diagnosis not present

## 2023-02-08 DIAGNOSIS — R000 Tachycardia, unspecified: Secondary | ICD-10-CM | POA: Diagnosis not present

## 2023-02-10 DIAGNOSIS — E669 Obesity, unspecified: Secondary | ICD-10-CM | POA: Diagnosis not present

## 2023-02-10 DIAGNOSIS — R7303 Prediabetes: Secondary | ICD-10-CM | POA: Diagnosis not present

## 2023-02-21 DIAGNOSIS — R052 Subacute cough: Secondary | ICD-10-CM | POA: Diagnosis not present

## 2023-02-21 DIAGNOSIS — Z8616 Personal history of COVID-19: Secondary | ICD-10-CM | POA: Diagnosis not present

## 2023-03-08 DIAGNOSIS — R7303 Prediabetes: Secondary | ICD-10-CM | POA: Diagnosis not present

## 2023-03-08 DIAGNOSIS — E669 Obesity, unspecified: Secondary | ICD-10-CM | POA: Diagnosis not present

## 2023-03-30 DIAGNOSIS — F902 Attention-deficit hyperactivity disorder, combined type: Secondary | ICD-10-CM | POA: Diagnosis not present

## 2023-03-30 DIAGNOSIS — F411 Generalized anxiety disorder: Secondary | ICD-10-CM | POA: Diagnosis not present

## 2023-03-30 DIAGNOSIS — F3181 Bipolar II disorder: Secondary | ICD-10-CM | POA: Diagnosis not present

## 2023-04-27 DIAGNOSIS — F319 Bipolar disorder, unspecified: Secondary | ICD-10-CM | POA: Diagnosis not present

## 2023-04-27 DIAGNOSIS — Z01419 Encounter for gynecological examination (general) (routine) without abnormal findings: Secondary | ICD-10-CM | POA: Diagnosis not present

## 2023-04-27 DIAGNOSIS — F419 Anxiety disorder, unspecified: Secondary | ICD-10-CM | POA: Diagnosis not present

## 2023-05-02 DIAGNOSIS — Z719 Counseling, unspecified: Secondary | ICD-10-CM | POA: Diagnosis not present

## 2023-05-02 DIAGNOSIS — R002 Palpitations: Secondary | ICD-10-CM | POA: Diagnosis not present

## 2023-05-02 DIAGNOSIS — R000 Tachycardia, unspecified: Secondary | ICD-10-CM | POA: Diagnosis not present

## 2023-05-02 DIAGNOSIS — Z8279 Family history of other congenital malformations, deformations and chromosomal abnormalities: Secondary | ICD-10-CM | POA: Diagnosis not present

## 2023-05-09 DIAGNOSIS — F411 Generalized anxiety disorder: Secondary | ICD-10-CM | POA: Diagnosis not present

## 2023-05-09 DIAGNOSIS — F902 Attention-deficit hyperactivity disorder, combined type: Secondary | ICD-10-CM | POA: Diagnosis not present

## 2023-05-09 DIAGNOSIS — E663 Overweight: Secondary | ICD-10-CM | POA: Diagnosis not present

## 2023-05-09 DIAGNOSIS — F3181 Bipolar II disorder: Secondary | ICD-10-CM | POA: Diagnosis not present

## 2023-05-09 DIAGNOSIS — Z713 Dietary counseling and surveillance: Secondary | ICD-10-CM | POA: Diagnosis not present
